# Patient Record
Sex: Male | Born: 1980 | Race: Black or African American | Hispanic: No
Health system: Southern US, Community
[De-identification: ages and names within clinical notes are randomized; demographics above are authoritative.]

## PROBLEM LIST (undated history)

## (undated) DIAGNOSIS — J45909 Unspecified asthma, uncomplicated: Secondary | ICD-10-CM

## (undated) DIAGNOSIS — E785 Hyperlipidemia, unspecified: Secondary | ICD-10-CM

## (undated) DIAGNOSIS — G4730 Sleep apnea, unspecified: Secondary | ICD-10-CM

## (undated) HISTORY — DX: Hyperlipidemia, unspecified: E78.5

## (undated) HISTORY — PX: TONSILLECTOMY: SUR1361

## (undated) HISTORY — DX: Sleep apnea, unspecified: G47.30

## (undated) HISTORY — PX: HERNIA REPAIR: SHX51

---

## 2015-05-18 ENCOUNTER — Encounter (HOSPITAL_COMMUNITY): Payer: Self-pay | Admitting: *Deleted

## 2015-05-18 ENCOUNTER — Emergency Department (HOSPITAL_COMMUNITY)
Admission: EM | Admit: 2015-05-18 | Discharge: 2015-05-18 | Disposition: A | Discharge status: Home / Self Care | Payer: Self-pay | Attending: Emergency Medicine | Admitting: Emergency Medicine

## 2015-05-18 ENCOUNTER — Emergency Department (HOSPITAL_COMMUNITY): Payer: Self-pay

## 2015-05-18 DIAGNOSIS — K611 Rectal abscess: Secondary | ICD-10-CM | POA: Insufficient documentation

## 2015-05-18 DIAGNOSIS — F17210 Nicotine dependence, cigarettes, uncomplicated: Secondary | ICD-10-CM | POA: Insufficient documentation

## 2015-05-18 DIAGNOSIS — M545 Low back pain: Secondary | ICD-10-CM | POA: Insufficient documentation

## 2015-05-18 LAB — BASIC METABOLIC PANEL
ANION GAP: 8 (ref 5–15)
BUN: <5 mg/dL — ABNORMAL LOW (ref 6–20)
CALCIUM: 9 mg/dL (ref 8.9–10.3)
CO2: 26 mmol/L (ref 22–32)
Chloride: 106 mmol/L (ref 101–111)
Creatinine, Ser: 1.21 mg/dL (ref 0.61–1.24)
Glucose, Bld: 112 mg/dL — ABNORMAL HIGH (ref 65–99)
Potassium: 4 mmol/L (ref 3.5–5.1)
Sodium: 140 mmol/L (ref 135–145)

## 2015-05-18 LAB — CBC WITH DIFFERENTIAL/PLATELET
BASOS ABS: 0 10*3/uL (ref 0.0–0.1)
BASOS PCT: 0 %
Eosinophils Absolute: 0.1 10*3/uL (ref 0.0–0.7)
Eosinophils Relative: 1 %
HEMATOCRIT: 49.2 % (ref 39.0–52.0)
Hemoglobin: 16.6 g/dL (ref 13.0–17.0)
Lymphocytes Relative: 10 %
Lymphs Abs: 1.4 10*3/uL (ref 0.7–4.0)
MCH: 28.2 pg (ref 26.0–34.0)
MCHC: 33.7 g/dL (ref 30.0–36.0)
MCV: 83.5 fL (ref 78.0–100.0)
MONO ABS: 1.5 10*3/uL — AB (ref 0.1–1.0)
Monocytes Relative: 11 %
NEUTROS ABS: 10.6 10*3/uL — AB (ref 1.7–7.7)
NEUTROS PCT: 78 %
Platelets: 224 10*3/uL (ref 150–400)
RBC: 5.89 MIL/uL — AB (ref 4.22–5.81)
RDW: 13.1 % (ref 11.5–15.5)
WBC: 13.6 10*3/uL — AB (ref 4.0–10.5)

## 2015-05-18 LAB — I-STAT CREATININE, ED: CREATININE: 1.1 mg/dL (ref 0.61–1.24)

## 2015-05-18 MED ORDER — LIDOCAINE HCL 2 % EX GEL
1.0000 "application " | Freq: Once | CUTANEOUS | Status: DC
  Filled 2015-05-18: qty 20

## 2015-05-18 MED ORDER — HYDROMORPHONE HCL 1 MG/ML IJ SOLN
1.0000 mg | Freq: Once | INTRAMUSCULAR | Status: AC
  Administered 2015-05-18: 1 mg via INTRAVENOUS
  Filled 2015-05-18: qty 1

## 2015-05-18 MED ORDER — SODIUM CHLORIDE 0.9 % IV BOLUS (SEPSIS)
1000.0000 mL | Freq: Once | INTRAVENOUS | Status: AC
  Administered 2015-05-18: 1000 mL via INTRAVENOUS

## 2015-05-18 MED ORDER — HYDROCODONE-ACETAMINOPHEN 5-325 MG PO TABS: 2.0000 | ORAL_TABLET | ORAL | Status: DC | PRN

## 2015-05-18 MED ORDER — HYDROMORPHONE HCL 1 MG/ML IJ SOLN
1.0000 mg | INTRAMUSCULAR | Status: DC | PRN
  Administered 2015-05-18 (×2): 1 mg via INTRAVENOUS
  Filled 2015-05-18 (×2): qty 1

## 2015-05-18 MED ORDER — ONDANSETRON HCL 4 MG/2ML IJ SOLN
4.0000 mg | Freq: Once | INTRAMUSCULAR | Status: AC
  Administered 2015-05-18: 4 mg via INTRAVENOUS
  Filled 2015-05-18: qty 2

## 2015-05-18 MED ORDER — LIDOCAINE-EPINEPHRINE (PF) 2 %-1:200000 IJ SOLN
10.0000 mL | Freq: Once | INTRAMUSCULAR | Status: AC
  Administered 2015-05-18: 10 mL
  Filled 2015-05-18: qty 20

## 2015-05-18 MED ORDER — SULFAMETHOXAZOLE-TRIMETHOPRIM 800-160 MG PO TABS: 2.0000 | ORAL_TABLET | Freq: Two times a day (BID) | ORAL | Status: DC

## 2015-05-18 MED ORDER — IOHEXOL 300 MG/ML  SOLN
100.0000 mL | Freq: Once | INTRAMUSCULAR | Status: AC | PRN
  Administered 2015-05-18: 100 mL via INTRAVENOUS

## 2015-05-18 NOTE — ED Notes (Signed)
PT reports rectal pain started 3 days ago and Pt reports the pain is so sever he can not have a BM.

## 2015-05-18 NOTE — ED Provider Notes (Signed)
By signing my name below, I, Tanda RockersMargaux Venter, attest that this documentation has been prepared under the direction and in the presence of Arthor CaptainAbigail Thomas Mabry, PA-C.  Electronically Signed: Tanda RockersMargaux Venter, ED Scribe. 05/18/2015. 11:33 AM.  SUBJECTIVE:  Lindell Sparoy L Germain Jr. is a 34 y.o. male who presents to the Emergency Department complaining of gradual onset, constant, 10/10, pain in the rectum and buttocks x 3 days. Pt cannot cough or have a bowel movement without pain. He has been taking Ibuprofen without relief. He also complains of pain behind his right ear that is itchy.                             OBJECTIVE:  Filed Vitals:   05/18/15 1039  BP: 150/93  Pulse: 141  Temp: 98.1 F (36.7 C)  Resp: 24    CONSTITUTIONAL: Pt appears to be in significant pain HEENT: Darkening of the skin folds behind right ear with some maceration that is itchy and tender. No overt signs of infections RECTUM: Bulging and tenderness up above the rectum with warmth and erythema  ASSESSMENT AND PLAN:  Concern for perirectal abscess. Pt getting fluids, CT Pelvis W Contrast. Will be moved to level of higher acuity.      I personally performed the services described in this documentation, which was scribed in my presence. The recorded information has been reviewed and is accurate.           Arthor Captainbigail Tarren Sabree, PA-C 05/18/15 1147  Rolland PorterMark James, MD 05/18/15 1420

## 2015-05-18 NOTE — ED Notes (Signed)
MD at bedside. 

## 2015-05-18 NOTE — ED Notes (Signed)
PT reports he last ate at 2100  On 05-17-15

## 2015-05-18 NOTE — ED Notes (Addendum)
Jelly applied; suture cart at bedside

## 2015-05-18 NOTE — Discharge Instructions (Signed)
Warm soaks, gently massage the area 2-3 times per day. Recheck with general surgery if this recurs.   Perirectal Abscess An abscess is an infected area that contains a collection of pus. A perirectal abscess is an abscess that is near the opening of the anus or around the rectum. A perirectal abscess can cause a lot of pain, especially during bowel movements. CAUSES This condition is almost always caused by an infection that starts in an anal gland. RISK FACTORS This condition is more likely to develop in:  People with diabetes or inflammatory bowel disease.  People whose body defense system (immune system) is weak.  People who have anal sex.  People who have a sexually transmitted disease (STD).  People who have certain kinds of cancers, such as rectal carcinoma, leukemia, or lymphoma. SYMPTOMS The main symptom of this condition is pain. The pain may be a throbbing pain that gets worse during bowel movements. Other symptoms include:  Fever.  Swelling.  Redness.  Bleeding.  Constipation. DIAGNOSIS The condition is diagnosed with a physical exam. If the abscess is not visible, a health care provider may need to place a finger inside the rectum to find the abscess. Sometimes, imaging tests are done to determine the size and location of the abscess. These tests may include:  An ultrasound.  An MRI.  A CT scan. TREATMENT This condition is usually treated with incision and drainage surgery. Incision and drainage surgery involves making an incision over the abscess to drain the pus. Treatment may also involve antibiotic medicine, pain medicine, stool softeners, or laxatives. HOME CARE INSTRUCTIONS  Take medicines only as directed by your health care provider.  If you were prescribed an antibiotic, finish all of it even if you start to feel better.  To relieve pain, try sitting:  In a warm, shallow bath (sitz bath).  On a heating pad with the setting on low.  On an  inflatable donut-shaped cushion.  Follow any diet instructions as directed by your health care provider.  Keep all follow-up visits as directed by your health care provider. This is important. SEEK MEDICAL CARE IF:  Your abscess is bleeding.  You have pain, swelling, or redness that is getting worse.  You are constipated.  You feel ill.  You have muscle aches or chills.  You have a fever.  Your symptoms return after the abscess has healed.   This information is not intended to replace advice given to you by your health care provider. Make sure you discuss any questions you have with your health care provider.   Document Released: 05/17/2000 Document Revised: 02/08/2015 Document Reviewed: 03/30/2014 Elsevier Interactive Patient Education [employer] Elsevier Inc.

## 2015-05-18 NOTE — ED Provider Notes (Signed)
CSN: [id number]     Arrival date & time 05/18/15  1033 History   First MD Initiated Contact with Patient 05/18/15 1117     Chief Complaint  Patient presents with  . Rectal Pain  . Back Pain      HPI  She presents for evaluation of rectal pain. Symptoms for 3 days. Having bowel movements to yesterday. Started taking a laxative yesterday still bowel movements today. He has a painful swollen tender area is perirectal area. Presents here for evaluation. No past similar episodes.  History reviewed. No pertinent past medical history. Past Surgical History  Procedure Laterality Date  . Tonsillectomy    . Hernia repair      at 11 years   History reviewed. No pertinent family history. Social History  Substance Use Topics  . Smoking status: Current Some Day Smoker    Types: Cigarettes  . Smokeless tobacco: Never Used  . Alcohol Use: No    Review of Systems  Constitutional: Negative for fever, chills, diaphoresis, appetite change and fatigue.  HENT: Negative for mouth sores, sore throat and trouble swallowing.   Eyes: Negative for visual disturbance.  Respiratory: Negative for cough, chest tightness, shortness of breath and wheezing.   Cardiovascular: Negative for chest pain.  Gastrointestinal: Negative for nausea, vomiting, abdominal pain, diarrhea and abdominal distention.       Rectal pain and constipation  Endocrine: Negative for polydipsia, polyphagia and polyuria.  Genitourinary: Negative for dysuria, frequency and hematuria.  Musculoskeletal: Negative for gait problem.  Skin: Negative for color change, pallor and rash.  Neurological: Negative for dizziness, syncope, light-headedness and headaches.  Hematological: Does not bruise/bleed easily.  Psychiatric/Behavioral: Negative for behavioral problems and confusion.      Allergies  Review of patient's allergies indicates no known allergies.  Home Medications   Prior to Admission medications   Medication Sig Start  Date End Date Taking? Authorizing Provider  ibuprofen (ADVIL,MOTRIN) 200 MG tablet Take 400 mg by mouth every 6 (six) hours as needed for mild pain.   Yes Historical Provider, MD  HYDROcodone-acetaminophen (NORCO/VICODIN) 5-325 MG tablet Take 2 tablets by mouth every 4 (four) hours as needed. 05/18/15   Rolland PorterMark Kayson Bullis, MD  sulfamethoxazole-trimethoprim (BACTRIM DS,SEPTRA DS) 800-160 MG tablet Take 2 tablets by mouth 2 (two) times daily. 05/18/15   Rolland PorterMark Kenji Mapel, MD   BP 105/56 mmHg  Pulse 112  Temp(Src) 98.1 F (36.7 C) (Oral)  Resp 20  SpO2 93% Physical Exam  Constitutional: He is oriented to person, place, and time. He appears well-developed and well-nourished. No distress.  HENT:  Head: Normocephalic.  Eyes: Conjunctivae are normal. Pupils are equal, round, and reactive to light. No scleral icterus.  Neck: Normal range of motion. Neck supple. No thyromegaly present.  Cardiovascular: Normal rate and regular rhythm.  Exam reveals no gallop and no friction rub.   No murmur heard. Pulmonary/Chest: Effort normal and breath sounds normal. No respiratory distress. He has no wheezes. He has no rales.  Abdominal: Soft. Bowel sounds are normal. He exhibits no distension. There is no tenderness. There is no rebound.  Genitourinary:     Musculoskeletal: Normal range of motion.  Neurological: He is alert and oriented to person, place, and time.  Skin: Skin is warm and dry. No rash noted.  Psychiatric: He has a normal mood and affect. His behavior is normal.    ED Course  Procedures (including critical care time) Labs Review Labs Reviewed  CBC WITH DIFFERENTIAL/PLATELET - Abnormal; Notable for the  following:    WBC 13.6 (*)    RBC 5.89 (*)    Neutro Abs 10.6 (*)    Monocytes Absolute 1.5 (*)    All other components within normal limits  BASIC METABOLIC PANEL - Abnormal; Notable for the following:    Glucose, Bld 112 (*)    BUN <5 (*)    All other components within normal limits  I-STAT  CREATININE, ED    Imaging Review Ct Pelvis W Contrast  05/18/2015  CLINICAL DATA:  Acute rectal pain. EXAM: CT PELVIS WITH CONTRAST TECHNIQUE: Multidetector CT imaging of the pelvis was performed using the standard protocol following the bolus administration of intravenous contrast. CONTRAST:  OMNIPAQUE IOHEXOL 300 MG/ML  SOLN COMPARISON:  None. FINDINGS: Urinary bladder appears normal. The appendix appears normal. There is no evidence of bowel obstruction. No significant adenopathy is noted. No osseous abnormality is noted. Multi lobulated fluid collection is seen posterior to the distal rectum and anus which measures 3.3 x 1.7 x 1.8 cm, concerning for abscess. IMPRESSION: Probable 3.3 x 1.8 x 1.7 cm multi lobulated abscess seen posterior to the distal rectum and anus. Electronically Signed   By: Lupita Raider, M.D.   On: 05/18/2015 14:04   I have personally reviewed and evaluated these images and lab results as part of my medical decision-making.   EKG Interpretation None      MDM   Final diagnoses:  Perirectal abscess    I again discussed drainage with the patient after CT. This is not appear to extend deep pelvic structures. I am able to visualize again, with great difficulty due to his discomfort, the area of abscess. However, he will not consent for me to drain this in the emergency room. I discussed general surgical consultation with him.  INCISION AND DRAINAGE Performed by: Claudean Kinds Consent: Verbal consent obtained. Risks and benefits: risks, benefits and alternatives were discussed Type: abscess  Body area: Peri rectal  Anesthesia: local infiltration  Incision was made with a scalpel.  Local anesthetic: lidocaine 1% c epinephrine  Anesthetic total: 4 ml  Complexity: complex Blunt dissection to break up loculations  Drainage: purulent  Drainage amount: moderate  Packing material: 1/4 in iodoform gauze  Patient tolerance: Patient tolerated the  procedure well with no immediate complications.       Rolland Porter, MD 05/18/15 [phone]

## 2015-10-06 ENCOUNTER — Encounter (HOSPITAL_COMMUNITY): Payer: Self-pay | Admitting: Emergency Medicine

## 2015-10-06 ENCOUNTER — Emergency Department (HOSPITAL_COMMUNITY): Payer: Self-pay

## 2015-10-06 ENCOUNTER — Emergency Department (HOSPITAL_COMMUNITY)
Admission: EM | Admit: 2015-10-06 | Discharge: 2015-10-06 | Disposition: A | Discharge status: Home / Self Care | Payer: Self-pay | Attending: Emergency Medicine | Admitting: Emergency Medicine

## 2015-10-06 DIAGNOSIS — R002 Palpitations: Secondary | ICD-10-CM | POA: Insufficient documentation

## 2015-10-06 DIAGNOSIS — Z792 Long term (current) use of antibiotics: Secondary | ICD-10-CM | POA: Insufficient documentation

## 2015-10-06 DIAGNOSIS — R0789 Other chest pain: Secondary | ICD-10-CM | POA: Insufficient documentation

## 2015-10-06 DIAGNOSIS — R000 Tachycardia, unspecified: Secondary | ICD-10-CM | POA: Insufficient documentation

## 2015-10-06 DIAGNOSIS — F17210 Nicotine dependence, cigarettes, uncomplicated: Secondary | ICD-10-CM | POA: Insufficient documentation

## 2015-10-06 DIAGNOSIS — M2550 Pain in unspecified joint: Secondary | ICD-10-CM | POA: Insufficient documentation

## 2015-10-06 LAB — CBC
HCT: 47 % (ref 39.0–52.0)
HEMOGLOBIN: 15.6 g/dL (ref 13.0–17.0)
MCH: 27.8 pg (ref 26.0–34.0)
MCHC: 33.2 g/dL (ref 30.0–36.0)
MCV: 83.6 fL (ref 78.0–100.0)
PLATELETS: 239 10*3/uL (ref 150–400)
RBC: 5.62 MIL/uL (ref 4.22–5.81)
RDW: 13.4 % (ref 11.5–15.5)
WBC: 9.3 10*3/uL (ref 4.0–10.5)

## 2015-10-06 LAB — D-DIMER, QUANTITATIVE (NOT AT ARMC): D DIMER QUANT: 0.33 ug{FEU}/mL (ref 0.00–0.50)

## 2015-10-06 LAB — I-STAT TROPONIN, ED: Troponin i, poc: 0 ng/mL (ref 0.00–0.08)

## 2015-10-06 LAB — BASIC METABOLIC PANEL
ANION GAP: 13 (ref 5–15)
BUN: 8 mg/dL (ref 6–20)
CALCIUM: 8.7 mg/dL — AB (ref 8.9–10.3)
CHLORIDE: 106 mmol/L (ref 101–111)
CO2: 24 mmol/L (ref 22–32)
CREATININE: 1.3 mg/dL — AB (ref 0.61–1.24)
GFR calc non Af Amer: >60 mL/min (ref >60)
Glucose, Bld: 151 mg/dL — ABNORMAL HIGH (ref 65–99)
Potassium: 3.4 mmol/L — ABNORMAL LOW (ref 3.5–5.1)
SODIUM: 143 mmol/L (ref 135–145)

## 2015-10-06 NOTE — Discharge Instructions (Signed)
Return to the emergency department if your symptoms significantly worsen or change.   Palpitations A palpitation is the feeling that your heartbeat is irregular or is faster than normal. It may feel like your heart is fluttering or skipping a beat. Palpitations are usually not a serious problem. However, in some cases, you may need further medical evaluation. CAUSES  Palpitations can be caused by:  Smoking.  Caffeine or other stimulants, such as diet pills or energy drinks.  Alcohol.  Stress and anxiety.  Strenuous physical activity.  Fatigue.  Certain medicines.  Heart disease, especially if you have a history of irregular heart rhythms (arrhythmias), such as atrial fibrillation, atrial flutter, or supraventricular tachycardia.  An improperly working pacemaker or defibrillator. DIAGNOSIS  To find the cause of your palpitations, your health care provider will take your medical history and perform a physical exam. Your health care provider may also have you take a test called an ambulatory electrocardiogram (ECG). An ECG records your heartbeat patterns over a 24-hour period. You may also have other tests, such as:  Transthoracic echocardiogram (TTE). During echocardiography, sound waves are used to evaluate how blood flows through your heart.  Transesophageal echocardiogram (TEE).  Cardiac monitoring. This allows your health care provider to monitor your heart rate and rhythm in real time.  Holter monitor. This is a portable device that records your heartbeat and can help diagnose heart arrhythmias. It allows your health care provider to track your heart activity for several days, if needed.  Stress tests by exercise or by giving medicine that makes the heart beat faster. TREATMENT  Treatment of palpitations depends on the cause of your symptoms and can vary greatly. Most cases of palpitations do not require any treatment other than time, relaxation, and monitoring your symptoms.  Other causes, such as atrial fibrillation, atrial flutter, or supraventricular tachycardia, usually require further treatment. HOME CARE INSTRUCTIONS   Avoid:  Caffeinated coffee, tea, soft drinks, diet pills, and energy drinks.  Chocolate.  Alcohol.  Stop smoking if you smoke.  Reduce your stress and anxiety. Things that can help you relax include:  A method of controlling things in your body, such as your heartbeats, with your mind (biofeedback).  Yoga.  Meditation.  Physical activity such as swimming, jogging, or walking.  Get plenty of rest and sleep. SEEK MEDICAL CARE IF:   You continue to have a fast or irregular heartbeat beyond 24 hours.  Your palpitations occur more often. SEEK IMMEDIATE MEDICAL CARE IF:  You have chest pain or shortness of breath.  You have a severe headache.  You feel dizzy or you faint. MAKE SURE YOU:  Understand these instructions.  Will watch your condition.  Will get help right away if you are not doing well or get worse.   This information is not intended to replace advice given to you by your health care provider. Make sure you discuss any questions you have with your health care provider.   Document Released: 05/17/2000 Document Revised: 05/25/2013 Document Reviewed: 07/19/2011 Elsevier Interactive Patient Education [employer] Elsevier Inc.

## 2015-10-06 NOTE — ED Notes (Signed)
Pt st's right chest pain woke him from sleep approx 30 mins ago.  Describes as a squeezing type pain.  Nausea without vomiting.  Denies diaphoresis

## 2015-10-06 NOTE — ED Provider Notes (Signed)
CSN: [id number]     Arrival date & time 10/06/15  0259 History   By signing my name below, I, Arlan OrganAshley Leger, attest that this documentation has been prepared under the direction and in the presence of Geoffery Lyonsouglas Trek Kimball, MD.  Electronically Signed: Arlan OrganAshley Leger, ED Scribe. 10/06/2015. 3:44 AM.   Chief Complaint  Patient presents with  . Chest Pain   The history is provided by the patient. No language interpreter was used.    HPI Comments: Travis SparRoy L Snelling Jr. is a 35 y.o. male without any pertinent past medical history who presents to the Emergency Department complaining of constant, ongoing L sided chest pain onset 30 minutes prior to arrival. Pain is described as tightness. He also reports a tightening sensation to the L arm and states he felt his heart racing. No aggravating or alleviating factors at this time. No OTC medications or home remedies attempted prior to arrival. No recent fever, chills, nausea, vomiting, diaphoresis, or shortness of breath. No prior personal history of heart issues. Denies any history of similar episodes. Pt denies any illicit drug use or alcohol consumption. Denies any recent caffeine intake. No known allergies to medications.  PCP: No primary care provider on file.    History reviewed. No pertinent past medical history. Past Surgical History  Procedure Laterality Date  . Tonsillectomy    . Hernia repair      at 11 years   No family history on file. Social History  Substance Use Topics  . Smoking status: Current Some Day Smoker    Types: Cigarettes  . Smokeless tobacco: Never Used  . Alcohol Use: No    Review of Systems  Constitutional: Negative for fever and chills.  Cardiovascular: Positive for chest pain and palpitations.  Gastrointestinal: Negative for nausea, vomiting and abdominal pain.  Musculoskeletal: Positive for arthralgias.  Neurological: Negative for headaches.  Psychiatric/Behavioral: Negative for confusion.  All other systems reviewed and are  negative.     Allergies  Review of patient's allergies indicates no known allergies.  Home Medications   Prior to Admission medications   Medication Sig Start Date End Date Taking? Authorizing Provider  HYDROcodone-acetaminophen (NORCO/VICODIN) 5-325 MG tablet Take 2 tablets by mouth every 4 (four) hours as needed. 05/18/15   Rolland PorterMark James, MD  ibuprofen (ADVIL,MOTRIN) 200 MG tablet Take 400 mg by mouth every 6 (six) hours as needed for mild pain.    Historical Provider, MD  sulfamethoxazole-trimethoprim (BACTRIM DS,SEPTRA DS) 800-160 MG tablet Take 2 tablets by mouth 2 (two) times daily. 05/18/15   Rolland PorterMark James, MD   Triage Vitals: BP 174/97 mmHg  Pulse 139  Temp(Src) 99.2 F (37.3 C) (Oral)  Resp 20  Ht 5\' 6"  (1.676 m)  Wt 262 lb 7 oz (119.041 kg)  BMI 42.38 kg/m2  SpO2 100%   Physical Exam  Constitutional: He is oriented to person, place, and time. He appears well-developed and well-nourished.  HENT:  Head: Normocephalic and atraumatic.  Eyes: EOM are normal.  Neck: Normal range of motion.  Cardiovascular: Regular rhythm, normal heart sounds and intact distal pulses.  Tachycardia present.   Pulmonary/Chest: Effort normal and breath sounds normal. No respiratory distress. He has no wheezes. He has no rales.  Abdominal: Soft. He exhibits no distension. There is no tenderness.  Musculoskeletal: Normal range of motion.  Neurological: He is alert and oriented to person, place, and time.  Skin: Skin is warm and dry.  Psychiatric: He has a normal mood and affect. Judgment normal.  Nursing note and vitals reviewed.   ED Course  Procedures (including critical care time)  DIAGNOSTIC STUDIES: Oxygen Saturation is 100% on RA, Normal by my interpretation.    COORDINATION OF CARE: 3:37 AM- Will order CXR, blood work, and EKG. Discussed treatment plan with pt at bedside and pt agreed to plan.     Labs Review Labs Reviewed  CBC  BASIC METABOLIC PANEL  I-STAT TROPOININ, ED     Imaging Review No results found. I have personally reviewed and evaluated these images and lab results as part of my medical decision-making.   EKG Interpretation   Date/Time:  Friday Oct 06 2015 03:04:23 EDT Ventricular Rate:  139 PR Interval:  126 QRS Duration: 78 QT Interval:  290 QTC Calculation: 441 R Axis:   79 Text Interpretation:  Sinus tachycardia ST \\T \ T wave abnormality,  consider inferior ischemia Abnormal ECG Confirmed by Yelitza Reach  MD, Williamson Cavanah  ([id number]) on 10/06/2015 3:40:54 AM      MDM   Final diagnoses:  None    Patient presents with complaints of heart palpitations. This started while he was sleeping and woke him from sleep. He was initially tachycardic upon presentation with a heart rate of 139. His initial EKG revealed a sinus tachycardia, but no other arrhythmia area he was observed on the monitor during which time his heart rate slowed to the low 90s. Workup was initiated including laboratory studies and chest x-ray. These were centrally unremarkable. A d-dimer was also obtained which was negative. I highly doubt pulmonary embolism. He does appear to be feeling better and I believe is appropriate for discharge. He is to return as needed for any problems.  I personally performed the services described in this documentation, which was scribed in my presence. The recorded information has been reviewed and is accurate.       Geoffery Lyons, MD 10/06/15 [phone]

## 2015-10-06 NOTE — ED Notes (Signed)
Patient presents stating he woke up with tightness to the left arm and pin point pain on the right side below the breast.  Stated he has never had this pain before.  Patient states the tightness to the arm is less but still notices it  Denies SOB,diaphoesis

## 2017-02-04 ENCOUNTER — Emergency Department (HOSPITAL_COMMUNITY)
Admission: EM | Admit: 2017-02-04 | Discharge: 2017-02-04 | Disposition: A | Discharge status: Home / Self Care | Payer: Self-pay | Attending: Emergency Medicine | Admitting: Emergency Medicine

## 2017-02-04 ENCOUNTER — Encounter (HOSPITAL_COMMUNITY): Payer: Self-pay | Admitting: Emergency Medicine

## 2017-02-04 DIAGNOSIS — M79652 Pain in left thigh: Secondary | ICD-10-CM | POA: Insufficient documentation

## 2017-02-04 DIAGNOSIS — F17210 Nicotine dependence, cigarettes, uncomplicated: Secondary | ICD-10-CM | POA: Insufficient documentation

## 2017-02-04 DIAGNOSIS — R202 Paresthesia of skin: Secondary | ICD-10-CM

## 2017-02-04 LAB — I-STAT CHEM 8, ED
BUN: 6 mg/dL (ref 6–20)
CALCIUM ION: 1.17 mmol/L (ref 1.15–1.40)
Chloride: 106 mmol/L (ref 101–111)
Creatinine, Ser: 1 mg/dL (ref 0.61–1.24)
GLUCOSE: 100 mg/dL — AB (ref 65–99)
HCT: 46 % (ref 39.0–52.0)
HEMOGLOBIN: 15.6 g/dL (ref 13.0–17.0)
POTASSIUM: 3.8 mmol/L (ref 3.5–5.1)
SODIUM: 143 mmol/L (ref 135–145)
TCO2: 27 mmol/L (ref 22–32)

## 2017-02-04 MED ORDER — IBUPROFEN 800 MG PO TABS: 800.0000 mg | ORAL_TABLET | Freq: Three times a day (TID) | ORAL | 0 refills | Status: DC | PRN

## 2017-02-04 NOTE — ED Triage Notes (Signed)
Pt reports numbness in outer left thigh x2 weeks, states walking makes it worse, denies any falls or back injuries.

## 2017-02-04 NOTE — Discharge Instructions (Signed)
Take the medication as prescribed. Call the number on these instructions to get a primary care physician and arrange to be seen if not feeling improved in a week. Ask your new primary care physician to help you to stop smoking

## 2017-02-04 NOTE — ED Provider Notes (Signed)
[facility]-EMERGENCY DEPT Provider Note   CSN: [id number] Arrival date & time: 02/04/17  0709     History   Chief Complaint Chief Complaint  Patient presents with  . Numbness    HPI Travis SparRoy L Halperin Jr. is a 36 y.o. male.  HPI Patient complains of pain and numbness in his left lateral thigh gradual in onset 2 weeks ago. He describes a sensation of "like when your foot falls asleep" discomfort is nonradiating worse with walking and improved with rest. No trauma no fever treated with Tylenol without relief. No other associated symptoms History reviewed. No pertinent past medical history.  There are no active problems to display for this patient.   Past Surgical History:  Procedure Laterality Date  . HERNIA REPAIR     at 11 years  . TONSILLECTOMY         Home Medications    Prior to Admission medications   Medication Sig Start Date End Date Taking? Authorizing Provider  HYDROcodone-acetaminophen (NORCO/VICODIN) 5-325 MG tablet Take 2 tablets by mouth every 4 (four) hours as needed. Patient not taking: Reported on 10/06/2015 05/18/15   Rolland PorterJames, Mark, MD  sulfamethoxazole-trimethoprim (BACTRIM DS,SEPTRA DS) 800-160 MG tablet Take 2 tablets by mouth 2 (two) times daily. Patient not taking: Reported on 10/06/2015 05/18/15   Rolland PorterJames, Mark, MD    Family History No family history on file.  Social History Social History  Substance Use Topics  . Smoking status: Current Some Day Smoker    Types: Cigarettes  . Smokeless tobacco: Never Used  . Alcohol use No   Denies illicit drug  Allergies   Patient has no known allergies.   Review of Systems Review of Systems  Constitutional: Negative.   HENT: Negative.   Respiratory: Negative.   Cardiovascular: Negative.   Gastrointestinal: Negative.   Musculoskeletal: Positive for myalgias.       Pain at left lateral thigh  Skin: Negative.   Neurological: Positive for numbness.       Numbness  in left lateral thigh  Psychiatric/Behavioral:  Negative.   All other systems reviewed and are negative.    Physical Exam Updated Vital Signs BP 125/81 (BP Location: Right Arm)   Pulse 61   Temp 98.9 F (37.2 C) (Oral)   Resp 14   SpO2 100%   Physical Exam  Constitutional: He is oriented to person, place, and time. He appears well-developed and well-nourished.  HENT:  Head: Normocephalic and atraumatic.  Eyes: Pupils are equal, round, and reactive to light. Conjunctivae are normal.  Neck: Neck supple. No tracheal deviation present. No thyromegaly present.  Cardiovascular: Normal rate and regular rhythm.   No murmur heard. Pulmonary/Chest: Effort normal and breath sounds normal.  Abdominal: Soft. Bowel sounds are normal. He exhibits no distension. There is no tenderness.  Musculoskeletal: Normal range of motion. He exhibits no edema or tenderness.  Left lower extremity no redness no swelling no tenderness. Good capillary refill area DP pulses 2+ bilaterally. All extremities without redness swelling or tenderness neurovascularly intact  Neurological: He is alert and oriented to person, place, and time. Coordination normal.  Gait normal  Skin: Skin is warm and dry. No rash noted.  Psychiatric: He has a normal mood and affect.  Nursing note and vitals reviewed.    ED Treatments / Results  Labs (all labs ordered are listed, but only abnormal results are displayed) Labs Reviewed - No data to display  EKG  EKG Interpretation None       Radiology  No results found.  Procedures Procedures (including critical care time)  Medications Ordered in ED Medications - No data to display Results for orders placed or performed during the hospital encounter of 02/04/17  I-stat chem 8, ed  Result Value Ref Range   Sodium 143 135 - 145 mmol/L   Potassium 3.8 3.5 - 5.1 mmol/L   Chloride 106 101 - 111 mmol/L   BUN 6 6 - 20 mg/dL   Creatinine, Ser 1.61 0.61 - 1.24 mg/dL   Glucose, Bld 096 (H) 65 - 99 mg/dL   Calcium, Ion 0.45  4.09 - 1.40 mmol/L   TCO2 27 22 - 32 mmol/L   Hemoglobin 15.6 13.0 - 17.0 g/dL   HCT 81.1 91.4 - 78.2 %   No results found.  Initial Impression / Assessment and Plan / ED Course  I have reviewed the triage vital signs and the nursing notes.  Pertinent labs & imaging results that were available during my care of the patient were reviewed by me and considered in my medical decision making (see chart for details).     Etiology of symptoms unclear. No evidence of phlebitis or circulatory compromise or infection.  prescription ibuprofen referral primary care. I counseled patient for 5 minutes on smoking cessation Final Clinical Impressions(s) / ED Diagnoses  Dx #1 left thigh pain #2 paresthesias #3 tobacco abuse Final diagnoses:  None    New Prescriptions New Prescriptions   No medications on file     Doug Sou, MD 02/04/17 1015

## 2017-02-21 ENCOUNTER — Encounter (HOSPITAL_COMMUNITY): Payer: Self-pay

## 2017-02-21 ENCOUNTER — Emergency Department (HOSPITAL_COMMUNITY)
Admission: EM | Admit: 2017-02-21 | Discharge: 2017-02-21 | Disposition: A | Discharge status: Home / Self Care | Payer: Self-pay | Attending: Emergency Medicine | Admitting: Emergency Medicine

## 2017-02-21 ENCOUNTER — Emergency Department (HOSPITAL_COMMUNITY): Payer: Self-pay

## 2017-02-21 DIAGNOSIS — R0602 Shortness of breath: Secondary | ICD-10-CM | POA: Insufficient documentation

## 2017-02-21 DIAGNOSIS — F17210 Nicotine dependence, cigarettes, uncomplicated: Secondary | ICD-10-CM | POA: Insufficient documentation

## 2017-02-21 DIAGNOSIS — R200 Anesthesia of skin: Secondary | ICD-10-CM | POA: Insufficient documentation

## 2017-02-21 DIAGNOSIS — Z791 Long term (current) use of non-steroidal anti-inflammatories (NSAID): Secondary | ICD-10-CM | POA: Insufficient documentation

## 2017-02-21 DIAGNOSIS — R42 Dizziness and giddiness: Secondary | ICD-10-CM | POA: Insufficient documentation

## 2017-02-21 DIAGNOSIS — R51 Headache: Secondary | ICD-10-CM | POA: Insufficient documentation

## 2017-02-21 DIAGNOSIS — R519 Headache, unspecified: Secondary | ICD-10-CM

## 2017-02-21 LAB — COMPREHENSIVE METABOLIC PANEL
ALT: 16 U/L — ABNORMAL LOW (ref 17–63)
ANION GAP: 5 (ref 5–15)
AST: 15 U/L (ref 15–41)
Albumin: 3.3 g/dL — ABNORMAL LOW (ref 3.5–5.0)
Alkaline Phosphatase: 45 U/L (ref 38–126)
BUN: 8 mg/dL (ref 6–20)
CHLORIDE: 110 mmol/L (ref 101–111)
CO2: 25 mmol/L (ref 22–32)
Calcium: 8.6 mg/dL — ABNORMAL LOW (ref 8.9–10.3)
Creatinine, Ser: 1.08 mg/dL (ref 0.61–1.24)
Glucose, Bld: 98 mg/dL (ref 65–99)
POTASSIUM: 4 mmol/L (ref 3.5–5.1)
SODIUM: 140 mmol/L (ref 135–145)
Total Bilirubin: 0.6 mg/dL (ref 0.3–1.2)
Total Protein: 5.8 g/dL — ABNORMAL LOW (ref 6.5–8.1)

## 2017-02-21 LAB — I-STAT TROPONIN, ED: TROPONIN I, POC: 0 ng/mL (ref 0.00–0.08)

## 2017-02-21 LAB — CBC WITH DIFFERENTIAL/PLATELET
Basophils Absolute: 0 10*3/uL (ref 0.0–0.1)
Basophils Relative: 1 %
EOS ABS: 0.2 10*3/uL (ref 0.0–0.7)
EOS PCT: 4 %
HCT: 46.9 % (ref 39.0–52.0)
Hemoglobin: 15.8 g/dL (ref 13.0–17.0)
LYMPHS ABS: 2 10*3/uL (ref 0.7–4.0)
LYMPHS PCT: 36 %
MCH: 27.7 pg (ref 26.0–34.0)
MCHC: 33.7 g/dL (ref 30.0–36.0)
MCV: 82.1 fL (ref 78.0–100.0)
Monocytes Absolute: 0.5 10*3/uL (ref 0.1–1.0)
Monocytes Relative: 10 %
Neutro Abs: 2.7 10*3/uL (ref 1.7–7.7)
Neutrophils Relative %: 49 %
PLATELETS: 195 10*3/uL (ref 150–400)
RBC: 5.71 MIL/uL (ref 4.22–5.81)
RDW: 13.3 % (ref 11.5–15.5)
WBC: 5.4 10*3/uL (ref 4.0–10.5)

## 2017-02-21 LAB — D-DIMER, QUANTITATIVE: D-Dimer, Quant: <0.27 ug/mL-FEU (ref 0.00–0.50)

## 2017-02-21 MED ORDER — SODIUM CHLORIDE 0.9 % IV BOLUS (SEPSIS)
1000.0000 mL | Freq: Once | INTRAVENOUS | Status: AC
  Administered 2017-02-21: 1000 mL via INTRAVENOUS

## 2017-02-21 MED ORDER — METOCLOPRAMIDE HCL 5 MG/ML IJ SOLN
10.0000 mg | Freq: Once | INTRAMUSCULAR | Status: AC
  Administered 2017-02-21: 10 mg via INTRAVENOUS
  Filled 2017-02-21: qty 2

## 2017-02-21 MED ORDER — DIPHENHYDRAMINE HCL 50 MG/ML IJ SOLN
12.5000 mg | Freq: Once | INTRAMUSCULAR | Status: AC
  Administered 2017-02-21: 12.5 mg via INTRAVENOUS
  Filled 2017-02-21: qty 1

## 2017-02-21 NOTE — ED Notes (Signed)
Pt ambulated to room from waiting area. Pt had no complaints while ambulating. Stated no dizziness.

## 2017-02-21 NOTE — ED Triage Notes (Signed)
Pt reports to the ed for feeling like his blood pressure is high. Vs wnl in triage. Patient states he felt woozy yesterday and went to bed at 12 pm last night feeling his normal and then woke up at 0700 with a numbness all across his head and tightness in his left lower leg so he felt like his blood pressure was high. Pt has no weakness or numbness anywhere else, alert, oriented, ambulatory and no difficulty with speech in triage.

## 2017-02-21 NOTE — ED Notes (Signed)
Pt verbalized understanding discharge instructions and denies any further needs or questions at this time. VS stable, ambulatory and steady gait.   

## 2017-02-21 NOTE — Discharge Instructions (Signed)
Take tylenol, motrin for headaches.   See neurologist for further evaluation   Return to ER if you have worse headaches, weakness, numbness, chest pain, shortness of breath.

## 2017-02-21 NOTE — ED Provider Notes (Signed)
[facility] Provider Note   CSN: [id number] Arrival date & time: 02/21/17  4098     History   Chief Complaint Chief Complaint  Patient presents with  . Hypertension    HPI Travis Staton. is a 36 y.o. male who presented with acute onset of headache, dizziness, shortness of breath. Patient states that he woke up this morning with sudden onset of headache as well as dizziness. He also states that he had some numbness on the left side as head as well as bilateral legs. He has some subjective shortness of breath and chest pain as well. He thought his blood pressure was elevated but did not take his blood pressure at home. Patient denies any slurred speech or actual weakness. He seen in the ED about a week ago for numbness in the legs are was thought to have sciatica. No recent travel, no hx of blood clots. Not on BP meds.   The history is provided by the patient.    History reviewed. No pertinent past medical history.  There are no active problems to display for this patient.   Past Surgical History:  Procedure Laterality Date  . HERNIA REPAIR     at 11 years  . TONSILLECTOMY         Home Medications    Prior to Admission medications   Medication Sig Start Date End Date Taking? Authorizing Provider  ibuprofen (ADVIL,MOTRIN) 600 MG tablet Take 600-1,200 mg by mouth every 6 (six) hours as needed for mild pain.   Yes [provider]  HYDROcodone-acetaminophen (NORCO/VICODIN) 5-325 MG tablet Take 2 tablets by mouth every 4 (four) hours as needed. Patient not taking: Reported on 10/06/2015 05/18/15   Rolland Porter, MD  ibuprofen (ADVIL,MOTRIN) 800 MG tablet Take 1 tablet (800 mg total) by mouth every 8 (eight) hours as needed for moderate pain. Take with food Patient not taking: Reported on 02/21/2017 02/04/17   Doug Sou, MD    Family History No family history on file.  Social History Social History  Substance Use Topics  . Smoking status: Current Some Day  Smoker    Types: Cigarettes  . Smokeless tobacco: Never Used  . Alcohol use No     Allergies   Patient has no known allergies.   Review of Systems Review of Systems  Respiratory: Positive for shortness of breath.   Cardiovascular: Positive for chest pain.  Neurological: Positive for numbness.  All other systems reviewed and are negative.    Physical Exam Updated Vital Signs BP 108/63   Pulse 61   Temp 98.6 F (37 C) (Oral)   Resp 13   Wt 118.8 kg (262 lb)   SpO2 96%   BMI 42.29 kg/m   Physical Exam  Constitutional: He is oriented to person, place, and time. He appears well-developed and well-nourished.  HENT:  Head: Normocephalic.  Mouth/Throat: Oropharynx is clear and moist.  Eyes: Pupils are equal, round, and reactive to light. Conjunctivae and EOM are normal.  Neck: Normal range of motion. Neck supple.  Cardiovascular: Normal rate, regular rhythm and normal heart sounds.   Pulmonary/Chest: Effort normal and breath sounds normal. No respiratory distress. He has no wheezes.  Abdominal: Soft. Bowel sounds are normal. He exhibits no distension. There is no tenderness.  Musculoskeletal: Normal range of motion.  Good peripheral pulses   Neurological: He is alert and oriented to person, place, and time.  CN 2-12 intact. Nl obvious facial droop. Nl strength and sensation throughout  Skin: Skin is warm.  Psychiatric: He has a normal mood and affect.  Nursing note and vitals reviewed.    ED Treatments / Results  Labs (all labs ordered are listed, but only abnormal results are displayed) Labs Reviewed  COMPREHENSIVE METABOLIC PANEL - Abnormal; Notable for the following:       Result Value   Calcium 8.6 (*)    Total Protein 5.8 (*)    Albumin 3.3 (*)    ALT 16 (*)    All other components within normal limits  CBC WITH DIFFERENTIAL/PLATELET  D-DIMER, QUANTITATIVE (NOT AT [facility])  I-STAT TROPONIN, ED    EKG  EKG Interpretation  Date/Time:  Friday February 21 2017 10:58:44 EDT Ventricular Rate:  75 PR Interval:    QRS Duration: 81 QT Interval:  361 QTC Calculation: 404 R Axis:   69 Text Interpretation:  Sinus rhythm No significant change since last tracing Confirmed by Richardean Canal [phone]) on 02/21/2017 11:04:27 AM Also confirmed by Richardean Canal [phone]), editor Madalyn Rob [phone])  on 02/21/2017 11:08:53 AM       Radiology Dg Chest 2 View  Result Date: 02/21/2017 CLINICAL DATA:  Chest pain. EXAM: CHEST  2 VIEW COMPARISON:  Radiographs of September 21, 2016. FINDINGS: The heart size and mediastinal contours are within normal limits. Both lungs are clear. No pneumothorax or pleural effusion is noted. The visualized skeletal structures are unremarkable. IMPRESSION: No active cardiopulmonary disease. Electronically Signed   By: Lupita Raider, M.D.   On: 02/21/2017 11:59   Ct Head Wo Contrast  Result Date: 02/21/2017 CLINICAL DATA:  Headache. EXAM: CT HEAD WITHOUT CONTRAST TECHNIQUE: Contiguous axial images were obtained from the base of the skull through the vertex without intravenous contrast. COMPARISON:  None. FINDINGS: Brain: No evidence of acute infarction, hemorrhage, hydrocephalus, extra-axial collection or mass lesion/mass effect. Vascular: No hyperdense vessel or unexpected calcification. Skull: Normal. Negative for fracture or focal lesion. Sinuses/Orbits: Left frontal sinusitis. Other: None. IMPRESSION: Normal head CT. Electronically Signed   By: Lupita Raider, M.D.   On: 02/21/2017 11:50    Procedures Procedures (including critical care time)  Medications Ordered in ED Medications  sodium chloride 0.9 % bolus 1,000 mL (0 mLs Intravenous Stopped 02/21/17 1259)  metoCLOPramide (REGLAN) injection 10 mg (10 mg Intravenous Given 02/21/17 1130)  diphenhydrAMINE (BENADRYL) injection 12.5 mg (12.5 mg Intravenous Given 02/21/17 1130)     Initial Impression / Assessment and Plan / ED Course  I have reviewed the triage vital signs and  the nursing notes.  Pertinent labs & imaging results that were available during my care of the patient were reviewed by me and considered in my medical decision making (see chart for details).     Travis Varnell. is a 36 y.o. male here with headaches, shortness of breath, chest pain, numbness. Likely complex migraines. Still has headaches currently. Headache onset around 7 am, less than 6 hr window so if CT head unremarkable, will not need LP. Will get d-dimer to r/o PE. He thought he was hypertensive but BP 129/70 in the ED. Will get labs, trop x 1, CXR. Will give migraine cocktail   2:11 PM CT head, labs, CXR clear. D-dimer neg. Felt better. BP remains normal. Likely complex migraine. Recommend tylenol, motrin. Will refer to neuro outpatient.   Final Clinical Impressions(s) / ED Diagnoses   Final diagnoses:  None    New Prescriptions New Prescriptions   No medications on file  Charlynne Pander, MD 02/21/17 [phone]

## 2017-08-11 ENCOUNTER — Other Ambulatory Visit: Payer: Self-pay

## 2017-08-11 ENCOUNTER — Emergency Department (HOSPITAL_COMMUNITY)
Admission: EM | Admit: 2017-08-11 | Discharge: 2017-08-11 | Disposition: A | Discharge status: Home / Self Care | Payer: Self-pay | Attending: Emergency Medicine | Admitting: Emergency Medicine

## 2017-08-11 ENCOUNTER — Encounter (HOSPITAL_COMMUNITY): Payer: Self-pay | Admitting: *Deleted

## 2017-08-11 DIAGNOSIS — K047 Periapical abscess without sinus: Secondary | ICD-10-CM | POA: Insufficient documentation

## 2017-08-11 DIAGNOSIS — Z87891 Personal history of nicotine dependence: Secondary | ICD-10-CM | POA: Insufficient documentation

## 2017-08-11 DIAGNOSIS — K0889 Other specified disorders of teeth and supporting structures: Secondary | ICD-10-CM

## 2017-08-11 DIAGNOSIS — R1110 Vomiting, unspecified: Secondary | ICD-10-CM | POA: Insufficient documentation

## 2017-08-11 MED ORDER — PENICILLIN V POTASSIUM 250 MG PO TABS
500.0000 mg | ORAL_TABLET | Freq: Once | ORAL | Status: AC
  Administered 2017-08-11: 500 mg via ORAL
  Filled 2017-08-11: qty 2

## 2017-08-11 MED ORDER — PENICILLIN V POTASSIUM 500 MG PO TABS: 500.0000 mg | ORAL_TABLET | Freq: Four times a day (QID) | ORAL | 0 refills | Status: AC

## 2017-08-11 MED ORDER — ONDANSETRON HCL 4 MG PO TABS: 4.0000 mg | ORAL_TABLET | Freq: Three times a day (TID) | ORAL | 0 refills | Status: DC | PRN

## 2017-08-11 MED ORDER — ONDANSETRON HCL 4 MG PO TABS
4.0000 mg | ORAL_TABLET | Freq: Once | ORAL | Status: AC
  Administered 2017-08-11: 4 mg via ORAL
  Filled 2017-08-11: qty 1

## 2017-08-11 NOTE — ED Triage Notes (Signed)
C/o right lower gum swollen ,states it started 2 days ago pain worse today

## 2017-08-11 NOTE — Discharge Instructions (Signed)
Please call to schedule him with a dentist for further evaluation of your dental infection.  Please use the antibiotics as directed for the next week.  Please use the nausea medicine to help with your nausea and stay hydrated.  You may use over-the-counter pain medications to help with the discomfort.  If any symptoms change or worsen, please return to the nearest emergency department.

## 2017-08-11 NOTE — ED Provider Notes (Signed)
[facility] Provider Note   CSN: [id number] Arrival date & time: 08/11/17  [id number]     History   Chief Complaint Chief Complaint  Patient presents with  . Dental Pain    HPI Travis SparRoy L Ruppert Jr. is a 37 y.o. male.  The history is provided by the patient. No language interpreter was used.  Dental Pain   This is a new problem. The current episode started more than 2 days ago. The problem has not changed since onset.The pain is at a severity of 8/10. The pain is moderate. He has tried acetaminophen (ibuprofen) for the symptoms. The treatment provided mild relief.  Emesis   This is a new problem. The problem occurs 2 to 4 times per day. The problem has not changed since onset.The emesis has an appearance of stomach contents. There has been no fever. Pertinent negatives include no abdominal pain, no chills, no cough, no diarrhea, no fever, no headaches and no URI.    History reviewed. No pertinent past medical history.  There are no active problems to display for this patient.   Past Surgical History:  Procedure Laterality Date  . HERNIA REPAIR     at 11 years  . TONSILLECTOMY         Home Medications    Prior to Admission medications   Medication Sig Start Date End Date Taking? Authorizing Provider  HYDROcodone-acetaminophen (NORCO/VICODIN) 5-325 MG tablet Take 2 tablets by mouth every 4 (four) hours as needed. Patient not taking: Reported on 10/06/2015 05/18/15   Rolland PorterJames, Mark, MD  ibuprofen (ADVIL,MOTRIN) 600 MG tablet Take 600-1,200 mg by mouth every 6 (six) hours as needed for mild pain.    [provider]  ibuprofen (ADVIL,MOTRIN) 800 MG tablet Take 1 tablet (800 mg total) by mouth every 8 (eight) hours as needed for moderate pain. Take with food Patient not taking: Reported on 02/21/2017 02/04/17   Doug SouJacubowitz, Sam, MD    Family History No family history on file.  Social History Social History   Tobacco Use  . Smoking  status: Former Smoker    Types: Cigarettes  . Smokeless tobacco: Never Used  Substance Use Topics  . Alcohol use: No  . Drug use: No     Allergies   Patient has no known allergies.   Review of Systems Review of Systems  Constitutional: Negative for chills, fatigue and fever.  HENT: Positive for dental problem. Negative for congestion, drooling, facial swelling, nosebleeds, postnasal drip, rhinorrhea, sore throat, tinnitus, trouble swallowing and voice change.   Eyes: Negative for visual disturbance.  Respiratory: Negative for cough, chest tightness and shortness of breath.   Cardiovascular: Negative for chest pain.  Gastrointestinal: Positive for vomiting. Negative for abdominal pain, constipation and diarrhea.  Genitourinary: Negative for flank pain.  Neurological: Negative for headaches.  Psychiatric/Behavioral: Negative for agitation.  All other systems reviewed and are negative.    Physical Exam Updated Vital Signs BP (!) 144/84 (BP Location: Right Arm)   Pulse 98   Temp 98.7 F (37.1 C) (Oral)   Resp 16   Ht 5\' 6"  (1.676 m)   Wt 113.4 kg (250 lb)   SpO2 98%   BMI 40.35 kg/m   Physical Exam  Constitutional: He is oriented to person, place, and time. He appears well-developed and well-nourished. No distress.  HENT:  Head: Normocephalic and atraumatic.  Mouth/Throat: Oropharynx is clear and moist. He does not have dentures. No trismus in the jaw. Abnormal dentition.  Dental caries present. No uvula swelling or lacerations. No oropharyngeal exudate or tonsillar abscesses.    Tenderness in right lower jaw and gumline.  Tooth missing.  No significant gum changes or drainage.  No area amenable for drainage.    Eyes: Conjunctivae and EOM are normal. Pupils are equal, round, and reactive to light.  Neck: Normal range of motion.  Cardiovascular: Normal rate and intact distal pulses.  No murmur heard. Pulmonary/Chest: Effort normal and breath sounds normal. No  respiratory distress. He has no wheezes. He exhibits no tenderness.  Abdominal: Soft. He exhibits no distension. There is no tenderness.  Musculoskeletal: He exhibits no edema or tenderness.  Neurological: He is alert and oriented to person, place, and time.  Skin: Skin is warm. Capillary refill takes less than 2 seconds. He is not diaphoretic. No erythema. No pallor.  Psychiatric: He has a normal mood and affect.  Nursing note and vitals reviewed.    ED Treatments / Results  Labs (all labs ordered are listed, but only abnormal results are displayed) Labs Reviewed - No data to display  EKG  EKG Interpretation None       Radiology No results found.  Procedures Procedures (including critical care time)  Medications Ordered in ED Medications  penicillin v potassium (VEETID) tablet 500 mg (not administered)  ondansetron (ZOFRAN) tablet 4 mg (not administered)     Initial Impression / Assessment and Plan / ED Course  I have reviewed the triage vital signs and the nursing notes.  Pertinent labs & imaging results that were available during my care of the patient were reviewed by me and considered in my medical decision making (see chart for details).     Travis Spar. is a 37 y.o. male no significant past medical history who presents with right dental pain.  Patient reports that for the last few days he has had a right sided lower dental pain.  He reports that he has a cracked tooth.  He says that he has not seen a dentist for this yet but had nausea and vomiting from the pain.  He reports taking ibuprofen with minimal pain relief.  He denies fevers or chills but does report the nausea.  He denies any difficulty swallowing breathing, or moving his neck.  He denies any trauma.  He has no other complaints on arrival.  On exam, patient has tenderness of his right lower teeth.  No drainable abscess is seen and there is no color change of the gums.  No purulence observed.  Patient  had no stridor and unremarkable posterior oropharyngeal exam.  No evidence of PTA.  No evidence of Ludwick's angina. Lungs clear and chest nontender.  Patient was offered a dental block for his dental pain however he refused.  Patient will be given penicillin and Zofran to help his symptoms and treat the likely dental infection.  Next  Patient given resources on dental infection and dentistry resources available.  Patient will follow up with dentist.  Patient understood return precautions.  Patient discharged in good condition.  Final Clinical Impressions(s) / ED Diagnoses   Final diagnoses:  Pain, dental  Dental infection    ED Discharge Orders        Ordered    penicillin v potassium (VEETID) 500 MG tablet  4 times daily     08/11/17 0852    ondansetron (ZOFRAN) 4 MG tablet  Every 8 hours PRN     08/11/17 1610  Clinical Impression: 1. Pain, dental   2. Dental infection     Disposition: Discharge  Condition: Good  I have discussed the results, Dx and Tx plan with the pt(& family if present). He/she/they expressed understanding and agree(s) with the plan. Discharge instructions discussed at great length. Strict return precautions discussed and pt &/or family have verbalized understanding of the instructions. No further questions at time of discharge.    New Prescriptions   ONDANSETRON (ZOFRAN) 4 MG TABLET    Take 1 tablet (4 mg total) by mouth every 8 (eight) hours as needed for nausea or vomiting.   PENICILLIN V POTASSIUM (VEETID) 500 MG TABLET    Take 1 tablet (500 mg total) by mouth 4 (four) times daily for 7 days.    Follow Up: [facility] EMERGENCY DEPARTMENT [address] [id number] mc [place] [address] [phone]       Ohana Birdwell, Canary Brim, MD 08/11/17 [phone]

## 2017-09-02 ENCOUNTER — Encounter (HOSPITAL_COMMUNITY): Payer: Self-pay | Admitting: Emergency Medicine

## 2017-09-02 ENCOUNTER — Emergency Department (HOSPITAL_COMMUNITY)
Admission: EM | Admit: 2017-09-02 | Discharge: 2017-09-02 | Disposition: A | Discharge status: Home / Self Care | Payer: Self-pay | Attending: Emergency Medicine | Admitting: Emergency Medicine

## 2017-09-02 ENCOUNTER — Other Ambulatory Visit: Payer: Self-pay

## 2017-09-02 ENCOUNTER — Emergency Department (HOSPITAL_COMMUNITY): Payer: Self-pay

## 2017-09-02 DIAGNOSIS — F17210 Nicotine dependence, cigarettes, uncomplicated: Secondary | ICD-10-CM | POA: Insufficient documentation

## 2017-09-02 DIAGNOSIS — E876 Hypokalemia: Secondary | ICD-10-CM | POA: Insufficient documentation

## 2017-09-02 DIAGNOSIS — R519 Headache, unspecified: Secondary | ICD-10-CM

## 2017-09-02 DIAGNOSIS — Z79899 Other long term (current) drug therapy: Secondary | ICD-10-CM | POA: Insufficient documentation

## 2017-09-02 DIAGNOSIS — R202 Paresthesia of skin: Secondary | ICD-10-CM | POA: Insufficient documentation

## 2017-09-02 DIAGNOSIS — R51 Headache: Secondary | ICD-10-CM | POA: Insufficient documentation

## 2017-09-02 LAB — CBC
HCT: 44.8 % (ref 39.0–52.0)
Hemoglobin: 14.7 g/dL (ref 13.0–17.0)
MCH: 27.1 pg (ref 26.0–34.0)
MCHC: 32.8 g/dL (ref 30.0–36.0)
MCV: 82.5 fL (ref 78.0–100.0)
PLATELETS: 237 10*3/uL (ref 150–400)
RBC: 5.43 MIL/uL (ref 4.22–5.81)
RDW: 13.1 % (ref 11.5–15.5)
WBC: 5.6 10*3/uL (ref 4.0–10.5)

## 2017-09-02 LAB — DIFFERENTIAL
BASOS ABS: 0 10*3/uL (ref 0.0–0.1)
BASOS PCT: 0 %
Eosinophils Absolute: 0.5 10*3/uL (ref 0.0–0.7)
Eosinophils Relative: 8 %
LYMPHS PCT: 45 %
Lymphs Abs: 2.5 10*3/uL (ref 0.7–4.0)
MONO ABS: 0.5 10*3/uL (ref 0.1–1.0)
MONOS PCT: 8 %
Neutro Abs: 2.2 10*3/uL (ref 1.7–7.7)
Neutrophils Relative %: 39 %

## 2017-09-02 LAB — COMPREHENSIVE METABOLIC PANEL
ALK PHOS: 44 U/L (ref 38–126)
ALT: 23 U/L (ref 17–63)
AST: 19 U/L (ref 15–41)
Albumin: 3.2 g/dL — ABNORMAL LOW (ref 3.5–5.0)
Anion gap: 9 (ref 5–15)
BUN: 7 mg/dL (ref 6–20)
CALCIUM: 8.5 mg/dL — AB (ref 8.9–10.3)
CHLORIDE: 108 mmol/L (ref 101–111)
CO2: 23 mmol/L (ref 22–32)
CREATININE: 0.97 mg/dL (ref 0.61–1.24)
GFR calc non Af Amer: >60 mL/min (ref >60)
Glucose, Bld: 115 mg/dL — ABNORMAL HIGH (ref 65–99)
Potassium: 3.6 mmol/L (ref 3.5–5.1)
SODIUM: 140 mmol/L (ref 135–145)
Total Bilirubin: 0.6 mg/dL (ref 0.3–1.2)
Total Protein: 5.3 g/dL — ABNORMAL LOW (ref 6.5–8.1)

## 2017-09-02 LAB — PROTIME-INR
INR: 0.98
PROTHROMBIN TIME: 12.9 s (ref 11.4–15.2)

## 2017-09-02 LAB — I-STAT CHEM 8, ED
BUN: 5 mg/dL — ABNORMAL LOW (ref 6–20)
CALCIUM ION: 1.11 mmol/L — AB (ref 1.15–1.40)
CHLORIDE: 107 mmol/L (ref 101–111)
Creatinine, Ser: 1 mg/dL (ref 0.61–1.24)
Glucose, Bld: 108 mg/dL — ABNORMAL HIGH (ref 65–99)
HCT: 42 % (ref 39.0–52.0)
Hemoglobin: 14.3 g/dL (ref 13.0–17.0)
POTASSIUM: 3.4 mmol/L — AB (ref 3.5–5.1)
SODIUM: 141 mmol/L (ref 135–145)
TCO2: 23 mmol/L (ref 22–32)

## 2017-09-02 LAB — URINALYSIS, ROUTINE W REFLEX MICROSCOPIC
Bilirubin Urine: NEGATIVE
Glucose, UA: NEGATIVE mg/dL
Hgb urine dipstick: NEGATIVE
Ketones, ur: NEGATIVE mg/dL
Leukocytes, UA: NEGATIVE
Nitrite: NEGATIVE
PH: 5 (ref 5.0–8.0)
Protein, ur: NEGATIVE mg/dL
SPECIFIC GRAVITY, URINE: 1.017 (ref 1.005–1.030)

## 2017-09-02 LAB — I-STAT TROPONIN, ED: TROPONIN I, POC: 0 ng/mL (ref 0.00–0.08)

## 2017-09-02 LAB — RAPID URINE DRUG SCREEN, HOSP PERFORMED
Amphetamines: NOT DETECTED
Barbiturates: NOT DETECTED
Benzodiazepines: NOT DETECTED
COCAINE: NOT DETECTED
OPIATES: NOT DETECTED
Tetrahydrocannabinol: NOT DETECTED

## 2017-09-02 LAB — APTT: APTT: 31 s (ref 24–36)

## 2017-09-02 LAB — ETHANOL

## 2017-09-02 MED ORDER — KETOROLAC TROMETHAMINE 30 MG/ML IJ SOLN
30.0000 mg | Freq: Once | INTRAMUSCULAR | Status: AC
  Administered 2017-09-02: 30 mg via INTRAVENOUS
  Filled 2017-09-02: qty 1

## 2017-09-02 MED ORDER — DIPHENHYDRAMINE HCL 50 MG/ML IJ SOLN
12.5000 mg | Freq: Once | INTRAMUSCULAR | Status: AC
  Administered 2017-09-02: 12.5 mg via INTRAVENOUS
  Filled 2017-09-02: qty 1

## 2017-09-02 MED ORDER — METOCLOPRAMIDE HCL 5 MG/ML IJ SOLN
10.0000 mg | Freq: Once | INTRAMUSCULAR | Status: AC
  Administered 2017-09-02: 10 mg via INTRAVENOUS
  Filled 2017-09-02: qty 2

## 2017-09-02 MED ORDER — POTASSIUM CHLORIDE CRYS ER 20 MEQ PO TBCR
40.0000 meq | EXTENDED_RELEASE_TABLET | Freq: Once | ORAL | Status: AC
  Administered 2017-09-02: 40 meq via ORAL
  Filled 2017-09-02: qty 2

## 2017-09-02 NOTE — ED Triage Notes (Signed)
Pt c/o L frontal HA onset yesterday, pt states he woke up @ 0200 with blurred vision in L eye. Pt reports a feeling of "heaviness" on L side of face.  Pt feels L arm and leg feel "sluggish" No facial droop noted no drift noted

## 2017-09-02 NOTE — ED Provider Notes (Signed)
[facility] EMERGENCY DEPARTMENT Provider Note   CSN: [id number] Arrival date & time: 09/02/17  0252     History   Chief Complaint Chief Complaint  Patient presents with  . Headache    HPI Travis Hatfield. is a 37 y.o. male with a hx of tonsillectomy presents to the Emergency Department complaining of constant, gradual, persistent, progressively worsening headache onset 2pm after eating a pot pie. Pt reports taking 2 tabs of Tylenol at 9:30PM without relief.  Pt reports the headache woke him from sleep at 2am.  Associated symptoms include "numbness and tingling" on the left side of his face with some left side blurring of his vision.  Pt denies slurred speech, gait disturbance, weakness in any extremity.  Pt reports he intermittently gets headaches, but has no known triggers.  No alleviating factors, no aggravating factors.  Pt denies photophobia or phonophobia.  Pt denies fever, chills, neck pain, chest pain, SOB, abd pain, N/V/D, weakness, dizziness, syncope.  Pt denies known trauma, falls, sick, contacts.       The history is provided by the patient and medical records. No language interpreter was used.    History reviewed. No pertinent past medical history.  There are no active problems to display for this patient.   Past Surgical History:  Procedure Laterality Date  . HERNIA REPAIR     at 11 years  . TONSILLECTOMY          Home Medications    Prior to Admission medications   Medication Sig Start Date End Date Taking? Authorizing Provider  amoxicillin (AMOXIL) 500 MG capsule Take 500 mg by mouth 3 (three) times daily.   Yes [provider]  diphenhydramine-acetaminophen (TYLENOL PM) 25-500 MG TABS tablet Take 2 tablets by mouth at bedtime as needed (pain/sleep).   Yes [provider]  ibuprofen (ADVIL,MOTRIN) 600 MG tablet Take 600-1,200 mg by mouth every 6 (six) hours as needed for mild pain.   Yes [provider]    ondansetron (ZOFRAN) 4 MG tablet Take 1 tablet (4 mg total) by mouth every 8 (eight) hours as needed for nausea or vomiting. 08/11/17  Yes Tegeler, Canary Brim, MD    Family History No family history on file.  Social History Social History   Tobacco Use  . Smoking status: Current Every Day Smoker    Types: Cigarettes  . Smokeless tobacco: Never Used  Substance Use Topics  . Alcohol use: No  . Drug use: No     Allergies   Patient has no known allergies.   Review of Systems Review of Systems  Constitutional: Negative for appetite change, diaphoresis, fatigue, fever and unexpected weight change.  HENT: Negative for mouth sores.   Eyes: Positive for visual disturbance.  Respiratory: Negative for cough, chest tightness, shortness of breath and wheezing.   Cardiovascular: Negative for chest pain.  Gastrointestinal: Negative for abdominal pain, constipation, diarrhea, nausea and vomiting.  Endocrine: Negative for polydipsia, polyphagia and polyuria.  Genitourinary: Negative for dysuria, frequency, hematuria and urgency.  Musculoskeletal: Negative for back pain and neck stiffness.  Skin: Negative for rash.  Allergic/Immunologic: Negative for immunocompromised state.  Neurological: Positive for light-headedness, numbness (facial) and headaches. Negative for syncope.  Hematological: Does not bruise/bleed easily.  Psychiatric/Behavioral: Negative for sleep disturbance. The patient is not nervous/anxious.      Physical Exam Updated Vital Signs BP (!) 151/90 (BP Location: Right Arm)   Pulse 84   Temp 98.4 F (36.9 C) (Oral)  Resp 18   Ht 5\' 3"  (1.6 m)   Wt 113.4 kg (250 lb)   SpO2 99%   BMI 44.29 kg/m   Physical Exam  Constitutional: He is oriented to person, place, and time. He appears well-developed and well-nourished. No distress.  HENT:  Head: Normocephalic and atraumatic.  Mouth/Throat: Oropharynx is clear and moist.  Eyes: Pupils are equal, round, and reactive  to light. Conjunctivae and EOM are normal. No scleral icterus.  No horizontal, vertical or rotational nystagmus  Neck: Normal range of motion. Neck supple.  Full active and passive ROM without pain No midline or paraspinal tenderness No nuchal rigidity or meningeal signs  Cardiovascular: Normal rate, regular rhythm and intact distal pulses.  Pulmonary/Chest: Effort normal and breath sounds normal. No respiratory distress. He has no wheezes. He has no rales.  Abdominal: Soft. Bowel sounds are normal. There is no tenderness. There is no rebound and no guarding.  Musculoskeletal: Normal range of motion.  Lymphadenopathy:    He has no cervical adenopathy.  Neurological: He is alert and oriented to person, place, and time. No cranial nerve deficit. He exhibits normal muscle tone. Coordination normal.  Mental Status:  Alert, oriented, thought content appropriate. Speech fluent without evidence of aphasia. Able to follow 2 step commands without difficulty.  Cranial Nerves:  II:  Peripheral visual fields grossly normal, pupils equal, round, reactive to light III,IV, VI: ptosis not present, extra-ocular motions intact bilaterally  V,VII: smile symmetric, facial light touch sensation equal VIII: hearing grossly normal bilaterally  IX,X: midline uvula rise  XI: bilateral shoulder shrug equal and strong XII: midline tongue extension  Motor:  5/5 in upper and lower extremities bilaterally including strong and equal grip strength and dorsiflexion/plantar flexion Sensory: light touch normal in all extremities; subjective decrease in sensation to the left side of the face  Cerebellar: normal finger-to-nose with bilateral upper extremities Gait: normal gait and balance CV: distal pulses palpable throughout   Skin: Skin is warm and dry. No rash noted. He is not diaphoretic.  Psychiatric: He has a normal mood and affect. His behavior is normal. Judgment and thought content normal.  Nursing note and  vitals reviewed.    ED Treatments / Results  Labs (all labs ordered are listed, but only abnormal results are displayed) Labs Reviewed  COMPREHENSIVE METABOLIC PANEL - Abnormal; Notable for the following components:      Result Value   Glucose, Bld 115 (*)    Calcium 8.5 (*)    Total Protein 5.3 (*)    Albumin 3.2 (*)    All other components within normal limits  I-STAT CHEM 8, ED - Abnormal; Notable for the following components:   Potassium 3.4 (*)    BUN 5 (*)    Glucose, Bld 108 (*)    Calcium, Ion 1.11 (*)    All other components within normal limits  ETHANOL  PROTIME-INR  APTT  CBC  DIFFERENTIAL  RAPID URINE DRUG SCREEN, HOSP PERFORMED  URINALYSIS, ROUTINE W REFLEX MICROSCOPIC  I-STAT TROPONIN, ED    EKG EKG Interpretation  Date/Time:  Tuesday September 02 2017 04:35:09 EDT Ventricular Rate:  67 PR Interval:    QRS Duration: 91 QT Interval:  363 QTC Calculation: 384 R Axis:   65 Text Interpretation:  Sinus rhythm Confirmed by Palumbo, April ([id number]) on 09/02/2017 5:06:58 AM   Radiology Ct Head Wo Contrast  Result Date: 09/02/2017 CLINICAL DATA:  37 year old male with headache and blurry vision. EXAM: CT HEAD WITHOUT CONTRAST  TECHNIQUE: Contiguous axial images were obtained from the base of the skull through the vertex without intravenous contrast. COMPARISON:  Head CT dated 02/21/2017 FINDINGS: Brain: No evidence of acute infarction, hemorrhage, hydrocephalus, extra-axial collection or mass lesion/mass effect. Vascular: No hyperdense vessel or unexpected calcification. Skull: Normal. Negative for fracture or focal lesion. Sinuses/Orbits: No acute finding. Other: Focal area of scalp contusion over the vertex, chronic. IMPRESSION: Unremarkable noncontrast CT of the brain. Electronically Signed   By: Elgie Collard M.D.   On: 09/02/2017 04:28    Procedures Procedures (including critical care time)  Medications Ordered in ED Medications  ketorolac (TORADOL) 30 MG/ML  injection 30 mg (30 mg Intravenous Given 09/02/17 0453)  metoCLOPramide (REGLAN) injection 10 mg (10 mg Intravenous Given 09/02/17 0453)  diphenhydrAMINE (BENADRYL) injection 12.5 mg (12.5 mg Intravenous Given 09/02/17 0453)  potassium chloride SA (K-DUR,KLOR-CON) CR tablet 40 mEq (40 mEq Oral Given 09/02/17 4098)     Initial Impression / Assessment and Plan / ED Course  I have reviewed the triage vital signs and the nursing notes.  Pertinent labs & imaging results that were available during my care of the patient were reviewed by me and considered in my medical decision making (see chart for details).  Clinical Course as of Sep 02 700  Tue Sep 02, 2017  1191 Pt reports improvement in headache and symptoms.    [HM]  0654 BP improved without intervention  BP: 110/61 [HM]  0654 Mild.  Oral potassium given here.    Potassium(!): 3.4 [HM]  0659 For complete resolution of facial paresthesias.  He ambulates here in the emergency department with steady gait.  No slurred speech.  No further focal symptoms.   [HM]    Clinical Course User Index [HM] Ova Meegan, Dahlia Client, PA-C   Patient with gradual onset headache.  Labs reassuring.  No chest pain or shortness of breath.  Normal neurologic exam on my initial evaluation.  CT head without acute abnormality.  I personally evaluated these images.  Will treat as complex migraine and reevaluate.  7:02 AM Pt HA treated and improved while in ED. Complete resolution of his facial paresthesias.  Normal gait and normal repeat neuro exam.  Presentation is non concerning for [facility], ICH, Meningitis, or temporal arteritis. Pt is afebrile with no focal neuro deficits, nuchal rigidity, or change in vision. Pt will be referred to neurology. Pt verbalizes understanding and is agreeable with plan to dc.    Final Clinical Impressions(s) / ED Diagnoses   Final diagnoses:  Bad headache  Paresthesias  Hypokalemia    ED Discharge Orders    None       Mardene Sayer  Boyd Kerbs 09/02/17 4782    Palumbo, April, MD 09/02/17 [phone]

## 2017-09-02 NOTE — Discharge Instructions (Addendum)
1. Medications: usual home medications 2. Treatment: rest, drink plenty of fluids,  3. Follow Up: Please followup with your primary doctor and neurology in 2-3 days for discussion of your diagnoses and further evaluation after today's visit; if you do not have a primary care doctor use the resource guide provided to find one; Please return to the ER for worsening symptoms, fevers, speech difficulty, difficulty walking or other concerns

## 2017-09-02 NOTE — ED Notes (Signed)
Patient left at this time with all belongings. 

## 2018-01-22 ENCOUNTER — Other Ambulatory Visit: Payer: Self-pay

## 2018-01-22 ENCOUNTER — Encounter (HOSPITAL_COMMUNITY): Payer: Self-pay | Admitting: *Deleted

## 2018-01-22 ENCOUNTER — Emergency Department (HOSPITAL_COMMUNITY)
Admission: EM | Admit: 2018-01-22 | Discharge: 2018-01-22 | Disposition: A | Discharge status: Home / Self Care | Payer: Self-pay | Attending: Emergency Medicine | Admitting: Emergency Medicine

## 2018-01-22 DIAGNOSIS — M62830 Muscle spasm of back: Secondary | ICD-10-CM | POA: Insufficient documentation

## 2018-01-22 DIAGNOSIS — Z87891 Personal history of nicotine dependence: Secondary | ICD-10-CM | POA: Insufficient documentation

## 2018-01-22 LAB — URINALYSIS, ROUTINE W REFLEX MICROSCOPIC
Bilirubin Urine: NEGATIVE
Glucose, UA: NEGATIVE mg/dL
HGB URINE DIPSTICK: NEGATIVE
Ketones, ur: NEGATIVE mg/dL
Leukocytes, UA: NEGATIVE
NITRITE: NEGATIVE
Protein, ur: NEGATIVE mg/dL
SPECIFIC GRAVITY, URINE: 1.015 (ref 1.005–1.030)
pH: 5 (ref 5.0–8.0)

## 2018-01-22 MED ORDER — NAPROXEN 375 MG PO TABS: 375.0000 mg | ORAL_TABLET | Freq: Two times a day (BID) | ORAL | 0 refills | Status: DC

## 2018-01-22 MED ORDER — KETOROLAC TROMETHAMINE 60 MG/2ML IM SOLN
30.0000 mg | Freq: Once | INTRAMUSCULAR | Status: AC
  Administered 2018-01-22: 30 mg via INTRAMUSCULAR
  Filled 2018-01-22: qty 2

## 2018-01-22 MED ORDER — CYCLOBENZAPRINE HCL 10 MG PO TABS: 10.0000 mg | ORAL_TABLET | Freq: Two times a day (BID) | ORAL | 0 refills | Status: DC | PRN

## 2018-01-22 NOTE — ED Triage Notes (Signed)
Pt c/o lower left sided back pain for the past few days. Denies injury. Reports urinary urgency and frequency.

## 2018-01-22 NOTE — ED Provider Notes (Signed)
[facility] EMERGENCY DEPARTMENT Provider Note   CSN: [id number] Arrival date & time: 01/22/18  2045     History   Chief Complaint Chief Complaint  Patient presents with  . Back Pain    HPI Travis Hatfield. is a 37 y.o. male.  Patient presents with left sided low back/flank pain onset three days ago. Pain is worse with movement and twisting. 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 No known injury.  The history is provided by the patient. No language interpreter was used.  Back Pain   The current episode started more than 2 days ago. The problem occurs daily. The pain is associated with no known injury. Pain location: left flank. The pain does not radiate. The pain is moderate. The symptoms are aggravated by twisting and certain positions. Pertinent negatives include no fever, no bowel incontinence, no bladder incontinence, no dysuria and no leg pain.    History reviewed. No pertinent past medical history.  There are no active problems to display for this patient.   Past Surgical History:  Procedure Laterality Date  . HERNIA REPAIR     at 11 years  . TONSILLECTOMY          Home Medications    Prior to Admission medications   Medication Sig Start Date End Date Taking? Authorizing Provider  amoxicillin (AMOXIL) 500 MG capsule Take 500 mg by mouth 3 (three) times daily.    [provider]  diphenhydramine-acetaminophen (TYLENOL PM) 25-500 MG TABS tablet Take 2 tablets by mouth at bedtime as needed (pain/sleep).    [provider]  ibuprofen (ADVIL,MOTRIN) 600 MG tablet Take 600-1,200 mg by mouth every 6 (six) hours as needed for mild pain.    [provider]  ondansetron (ZOFRAN) 4 MG tablet Take 1 tablet (4 mg total) by mouth every 8 (eight) hours as needed for nausea or vomiting. 08/11/17   Tegeler, Canary Brim, MD    Family History No family history on file.  Social History Social History    Tobacco Use  . Smoking status: Former Smoker    Types: Cigarettes  . Smokeless tobacco: Never Used  Substance Use Topics  . Alcohol use: No  . Drug use: No     Allergies   Patient has no known allergies.   Review of Systems Review of Systems  Constitutional: Negative for fever.  Gastrointestinal: Negative for bowel incontinence.  Genitourinary: Negative for bladder incontinence and dysuria.  Musculoskeletal: Positive for back pain.  All other systems reviewed and are negative.    Physical Exam Updated Vital Signs BP (!) 148/85   Pulse 89   Temp 98.9 F (37.2 C) (Oral)   Resp 16   SpO2 98%   Physical Exam  Constitutional: He is oriented to person, place, and time. He appears well-developed and well-nourished.  HENT:  Head: Normocephalic.  Eyes: EOM are normal.  Neck: Neck supple.  Cardiovascular: Normal rate and regular rhythm.  Pulmonary/Chest: Effort normal and breath sounds normal.  Abdominal: Soft. Bowel sounds are normal.  Musculoskeletal: Normal range of motion. He exhibits tenderness. He exhibits no edema.       Lumbar back: He exhibits pain and spasm.       Back:  Neurological: He is alert and oriented to person, place, and time.  Skin: Skin is warm and dry.  Psychiatric: He has a normal mood and affect.  Nursing note and vitals reviewed.    ED  Treatments / Results  Labs (all labs ordered are listed, but only abnormal results are displayed) Labs Reviewed  URINALYSIS, ROUTINE W REFLEX MICROSCOPIC    EKG None  Radiology No results found.  Procedures Procedures (including critical care time)  Medications Ordered in ED Medications  ketorolac (TORADOL) injection 30 mg (30 mg Intramuscular Given 01/22/18 2231)     Initial Impression / Assessment and Plan / ED Course  I have reviewed the triage vital signs and the nursing notes.  Pertinent labs & imaging results that were available during my care of the patient were reviewed by me and  considered in my medical decision making (see chart for details).     Patient with back pain.  No neurological deficits and normal neuro exam.  Patient is ambulatory.  No loss of bowel or bladder control.  No concern for cauda equina.  No fever, night sweats, weight loss, h/o cancer, IVDA, no recent procedure to back. No urinary symptoms suggestive of UTI.  Supportive care and return precaution discussed. Appears safe for discharge at this time. Follow up as indicated in discharge paperwork.   Final Clinical Impressions(s) / ED Diagnoses   Final diagnoses:  Muscle spasm of back    ED Discharge Orders         Ordered    naproxen (NAPROSYN) 375 MG tablet  2 times daily     01/22/18 2206    cyclobenzaprine (FLEXERIL) 10 MG tablet  2 times daily PRN     01/22/18 2206           Felicie MornSmith, Cressida Milford, NP 01/23/18 0000    Wynetta FinesMessick, Peter C, MD 01/23/18 [phone]

## 2018-02-25 ENCOUNTER — Encounter (HOSPITAL_COMMUNITY): Payer: Self-pay | Admitting: Emergency Medicine

## 2018-02-25 ENCOUNTER — Emergency Department (HOSPITAL_COMMUNITY)
Admission: EM | Admit: 2018-02-25 | Discharge: 2018-02-25 | Disposition: A | Discharge status: Home / Self Care | Payer: Self-pay | Attending: Emergency Medicine | Admitting: Emergency Medicine

## 2018-02-25 ENCOUNTER — Emergency Department (HOSPITAL_COMMUNITY): Payer: Self-pay

## 2018-02-25 DIAGNOSIS — Z87891 Personal history of nicotine dependence: Secondary | ICD-10-CM | POA: Insufficient documentation

## 2018-02-25 DIAGNOSIS — K529 Noninfective gastroenteritis and colitis, unspecified: Secondary | ICD-10-CM | POA: Insufficient documentation

## 2018-02-25 DIAGNOSIS — E860 Dehydration: Secondary | ICD-10-CM | POA: Insufficient documentation

## 2018-02-25 LAB — CBC WITH DIFFERENTIAL/PLATELET
Abs Immature Granulocytes: 0 10*3/uL (ref 0.0–0.1)
BASOS ABS: 0 10*3/uL (ref 0.0–0.1)
BASOS PCT: 0 %
EOS PCT: 2 %
Eosinophils Absolute: 0.2 10*3/uL (ref 0.0–0.7)
HEMATOCRIT: 49.1 % (ref 39.0–52.0)
Hemoglobin: 15.9 g/dL (ref 13.0–17.0)
Immature Granulocytes: 0 %
Lymphocytes Relative: 17 %
Lymphs Abs: 1.6 10*3/uL (ref 0.7–4.0)
MCH: 27.1 pg (ref 26.0–34.0)
MCHC: 32.4 g/dL (ref 30.0–36.0)
MCV: 83.6 fL (ref 78.0–100.0)
Monocytes Absolute: 1 10*3/uL (ref 0.1–1.0)
Monocytes Relative: 12 %
Neutro Abs: 6.2 10*3/uL (ref 1.7–7.7)
Neutrophils Relative %: 69 %
Platelets: 252 10*3/uL (ref 150–400)
RBC: 5.87 MIL/uL — AB (ref 4.22–5.81)
RDW: 12.9 % (ref 11.5–15.5)
WBC: 9 10*3/uL (ref 4.0–10.5)

## 2018-02-25 LAB — COMPREHENSIVE METABOLIC PANEL
ALT: 18 U/L (ref 0–44)
AST: 19 U/L (ref 15–41)
Albumin: 3.8 g/dL (ref 3.5–5.0)
Alkaline Phosphatase: 55 U/L (ref 38–126)
Anion gap: 8 (ref 5–15)
BILIRUBIN TOTAL: 0.6 mg/dL (ref 0.3–1.2)
BUN: 7 mg/dL (ref 6–20)
CALCIUM: 8.6 mg/dL — AB (ref 8.9–10.3)
CO2: 25 mmol/L (ref 22–32)
CREATININE: 1.05 mg/dL (ref 0.61–1.24)
Chloride: 106 mmol/L (ref 98–111)
Glucose, Bld: 108 mg/dL — ABNORMAL HIGH (ref 70–99)
Potassium: 4 mmol/L (ref 3.5–5.1)
Sodium: 139 mmol/L (ref 135–145)
TOTAL PROTEIN: 6.6 g/dL (ref 6.5–8.1)

## 2018-02-25 LAB — URINALYSIS, ROUTINE W REFLEX MICROSCOPIC
Bilirubin Urine: NEGATIVE
Glucose, UA: NEGATIVE mg/dL
Hgb urine dipstick: NEGATIVE
Ketones, ur: NEGATIVE mg/dL
Leukocytes, UA: NEGATIVE
NITRITE: NEGATIVE
PH: 5 (ref 5.0–8.0)
Protein, ur: NEGATIVE mg/dL
Specific Gravity, Urine: 1.021 (ref 1.005–1.030)

## 2018-02-25 LAB — I-STAT CG4 LACTIC ACID, ED
LACTIC ACID, VENOUS: 1.04 mmol/L (ref 0.5–1.9)
Lactic Acid, Venous: 1.98 mmol/L — ABNORMAL HIGH (ref 0.5–1.9)

## 2018-02-25 LAB — LIPASE, BLOOD: Lipase: 41 U/L (ref 11–51)

## 2018-02-25 MED ORDER — ONDANSETRON HCL 4 MG PO TABS: 4.0000 mg | ORAL_TABLET | Freq: Three times a day (TID) | ORAL | 0 refills | Status: DC | PRN

## 2018-02-25 MED ORDER — ONDANSETRON HCL 4 MG/2ML IJ SOLN
4.0000 mg | Freq: Once | INTRAMUSCULAR | Status: AC
  Administered 2018-02-25: 4 mg via INTRAVENOUS
  Filled 2018-02-25: qty 2

## 2018-02-25 MED ORDER — ACETAMINOPHEN 500 MG PO TABS
1000.0000 mg | ORAL_TABLET | Freq: Once | ORAL | Status: AC
  Administered 2018-02-25: 1000 mg via ORAL
  Filled 2018-02-25: qty 2

## 2018-02-25 MED ORDER — SODIUM CHLORIDE 0.9 % IV BOLUS
1000.0000 mL | Freq: Once | INTRAVENOUS | Status: AC
  Administered 2018-02-25: 1000 mL via INTRAVENOUS

## 2018-02-25 NOTE — ED Provider Notes (Signed)
[facility] EMERGENCY DEPARTMENT Provider Note   CSN: [id number] Arrival date & time: 02/25/18  1452     History   Chief Complaint Chief Complaint  Patient presents with  . Fever  . Tachycardia  . Diarrhea    HPI Travis Hatfield. is a 37 y.o. male.  HPI Patient developed vomiting and diarrhea that started yesterday evening.  He has had roughly 3-4 episodes of each.  No grossly bloody or melanotic stools.  Complains of diffuse myalgias.  Went to give plasma this morning and was refused due to tachycardia and fever.  States his children have been sick for over the last 2 days with vomiting and diarrhea.  No recent foreign travel.  Patient is primarily concerned with his tachycardia. History reviewed. No pertinent past medical history.  There are no active problems to display for this patient.   Past Surgical History:  Procedure Laterality Date  . HERNIA REPAIR     at 11 years  . TONSILLECTOMY          Home Medications    Prior to Admission medications   Medication Sig Start Date End Date Taking? Authorizing Provider  cyclobenzaprine (FLEXERIL) 10 MG tablet Take 1 tablet (10 mg total) by mouth 2 (two) times daily as needed for muscle spasms. 01/22/18  Yes Felicie Morn, NP  diphenhydramine-acetaminophen (TYLENOL PM) 25-500 MG TABS tablet Take 2 tablets by mouth at bedtime as needed (pain/sleep).   Yes [provider]  ibuprofen (ADVIL,MOTRIN) 200 MG tablet Take 800 mg by mouth every 6 (six) hours as needed (for pain).   Yes [provider]  naproxen (NAPROSYN) 375 MG tablet Take 1 tablet (375 mg total) by mouth 2 (two) times daily. Patient not taking: Reported on 02/25/2018 01/22/18   Felicie Morn, NP  ondansetron [facility]) 4 MG tablet Take 1 tablet (4 mg total) by mouth every 8 (eight) hours as needed for nausea or vomiting. 02/25/18   Loren Racer, MD    Family History History reviewed. No pertinent family history.  Social  History Social History   Tobacco Use  . Smoking status: Former Smoker    Types: Cigarettes  . Smokeless tobacco: Never Used  Substance Use Topics  . Alcohol use: No  . Drug use: No     Allergies   Patient has no known allergies.   Review of Systems Review of Systems  Constitutional: Positive for chills, fatigue and fever.  HENT: Negative for sore throat and trouble swallowing.   Respiratory: Negative for cough and shortness of breath.   Cardiovascular: Negative for chest pain.  Gastrointestinal: Positive for diarrhea, nausea and vomiting. Negative for abdominal pain, blood in stool and constipation.  Genitourinary: Negative for dysuria, flank pain and frequency.  Musculoskeletal: Positive for myalgias. Negative for back pain, neck pain and neck stiffness.  Skin: Negative for rash and wound.  Neurological: Negative for dizziness, weakness, light-headedness, numbness and headaches.  All other systems reviewed and are negative.    Physical Exam Updated Vital Signs BP 123/82   Pulse (!) 101   Temp 99.7 F (37.6 C) (Oral)   Resp 18   Ht 5\' 6"  (1.676 m)   Wt 120.2 kg   SpO2 95%   BMI 42.77 kg/m   Physical Exam  Constitutional: He is oriented to person, place, and time. He appears well-developed and well-nourished. No distress.  HENT:  Head: Normocephalic and atraumatic.  Mouth/Throat: Oropharynx is clear and moist. No oropharyngeal exudate.  Eyes:  Pupils are equal, round, and reactive to light. EOM are normal.  Neck: Normal range of motion. Neck supple. No JVD present.  Cardiovascular: Regular rhythm.  Tachycardia  Pulmonary/Chest: Effort normal and breath sounds normal. No stridor. No respiratory distress. He has no wheezes. He has no rales. He exhibits no tenderness.  Abdominal: Soft. Bowel sounds are normal. There is no tenderness. There is no rebound and no guarding.  Nontender abdominal exam.  Musculoskeletal: Normal range of motion. He exhibits no edema or  tenderness.  No midline thoracic or lumbar tenderness.  No CVA tenderness bilaterally.  No lower extremity swelling, asymmetry or tenderness.  Lymphadenopathy:    He has no cervical adenopathy.  Neurological: He is alert and oriented to person, place, and time.  Moves all extremities without focal deficit.  Sensation fully intact.  Skin: Skin is warm and dry. Capillary refill takes less than 2 seconds. No rash noted. He is not diaphoretic. No erythema.  Psychiatric: His behavior is normal.  Mildly anxious appearing  Nursing note and vitals reviewed.    ED Treatments / Results  Labs (all labs ordered are listed, but only abnormal results are displayed) Labs Reviewed  COMPREHENSIVE METABOLIC PANEL - Abnormal; Notable for the following components:      Result Value   Glucose, Bld 108 (*)    Calcium 8.6 (*)    All other components within normal limits  CBC WITH DIFFERENTIAL/PLATELET - Abnormal; Notable for the following components:   RBC 5.87 (*)    All other components within normal limits  I-STAT CG4 LACTIC ACID, ED - Abnormal; Notable for the following components:   Lactic Acid, Venous 1.98 (*)    All other components within normal limits  URINALYSIS, ROUTINE W REFLEX MICROSCOPIC  LIPASE, BLOOD  I-STAT CG4 LACTIC ACID, ED    EKG EKG Interpretation  Date/Time:  Wednesday February 25 2018 15:07:03 EDT Ventricular Rate:  119 PR Interval:  134 QRS Duration: 76 QT Interval:  302 QTC Calculation: 424 R Axis:   89 Text Interpretation:  Sinus tachycardia Possible Left atrial enlargement Nonspecific ST and T wave abnormality Abnormal ECG Confirmed by Loren Racer ([id number]) on 02/25/2018 9:22:59 PM   Radiology Dg Chest 2 View  Result Date: 02/25/2018 CLINICAL DATA:  Patient reports NVD since yesterday. Vomited 3 times, 1 occasion he noticed some blood in it. No blood seen in stool. Fever earlier today of 102 degrees. Denies SOB, some center chest pains last night. Ex smoker  quit 9 months ago.*comment was truncated* EXAM: CHEST - 2 VIEW COMPARISON:  02/21/17 FINDINGS: Normal mediastinum and cardiac silhouette. Normal pulmonary vasculature. No evidence of effusion, infiltrate, or pneumothorax. No acute bony abnormality. IMPRESSION: No acute cardiopulmonary process. Electronically Signed   By: Genevive Bi M.D.   On: 02/25/2018 16:09    Procedures Procedures (including critical care time)  Medications Ordered in ED Medications  sodium chloride 0.9 % bolus 1,000 mL (0 mLs Intravenous Stopped 02/25/18 1849)  ondansetron (ZOFRAN) injection 4 mg (4 mg Intravenous Given 02/25/18 1720)  acetaminophen (TYLENOL) tablet 1,000 mg (1,000 mg Oral Given 02/25/18 1720)  sodium chloride 0.9 % bolus 1,000 mL (0 mLs Intravenous Stopped 02/25/18 2057)     Initial Impression / Assessment and Plan / ED Course  I have reviewed the triage vital signs and the nursing notes.  Pertinent labs & imaging results that were available during my care of the patient were reviewed by me and considered in my medical decision making (see chart for  details).     Tachycardia is nearly resolved with IV fluids.  Mild elevation in lactic acid is also resolved.  Symptoms likely related to dehydration from gastroenteritis.  Has not had any chest pain at any point.  Return precautions given. Final Clinical Impressions(s) / ED Diagnoses   Final diagnoses:  Dehydration  Gastroenteritis    ED Discharge Orders         Ordered    ondansetron (ZOFRAN) 4 MG tablet  Every 8 hours PRN     02/25/18 2125           Loren Racer, MD 02/25/18 2125

## 2018-02-25 NOTE — ED Triage Notes (Signed)
Pt presents with diarrhea, tachycardia, and fever that began yesterday; pt states he went to donate plasma and they refused and told him to seek treatment

## 2018-02-25 NOTE — ED Provider Notes (Signed)
Patient placed in Quick Look pathway, seen and evaluated   Chief Complaint: Fever, tachycardia, N/V/D  HPI:   Lindell SparRoy L Jenning Jr. is a 37 y.o. male who is otherwise healthy presents to ED for evaluation of fever, and elevated HR. Reports he started feeling unwell yesterday with nausea, vomiting and diarrhea. Went to donate plasma today and was turned away due to fever of 102 and HR of 120. Pt reports his children are sick with similar symptoms, but less severe. Not having abd pain today, but still N/V/D. No cough, CP, or SOB, mild rhinorrhea  ROS: + fever, N/V/D - abd pain, cough, CP, SOB  Physical Exam:   Gen: No distress  Neuro: Awake and Alert  Skin: Warm    Focused Exam: Mild tenderness over diffuse abdomen, no guarding or rebound, no rigidity. Tachy w/ regular rhythm, lungs CTA bilat   Initiation of care has begun. The patient has been counseled on the process, plan, and necessity for staying for the completion/evaluation, and the remainder of the medical screening examination    Legrand RamsFord, Arlin Sass N, PA-C 02/25/18 1536    Derwood KaplanNanavati, Ankit, MD 02/27/18 1537

## 2018-03-01 ENCOUNTER — Emergency Department (HOSPITAL_COMMUNITY)
Admission: EM | Admit: 2018-03-01 | Discharge: 2018-03-01 | Disposition: A | Discharge status: Home / Self Care | Payer: Self-pay | Attending: Emergency Medicine | Admitting: Emergency Medicine

## 2018-03-01 ENCOUNTER — Other Ambulatory Visit: Payer: Self-pay

## 2018-03-01 ENCOUNTER — Emergency Department (HOSPITAL_COMMUNITY): Payer: Self-pay

## 2018-03-01 ENCOUNTER — Encounter (HOSPITAL_COMMUNITY): Payer: Self-pay

## 2018-03-01 DIAGNOSIS — R0602 Shortness of breath: Secondary | ICD-10-CM | POA: Insufficient documentation

## 2018-03-01 DIAGNOSIS — Z5321 Procedure and treatment not carried out due to patient leaving prior to being seen by health care provider: Secondary | ICD-10-CM | POA: Insufficient documentation

## 2018-03-01 LAB — CBC
HCT: 46.4 % (ref 39.0–52.0)
Hemoglobin: 15 g/dL (ref 13.0–17.0)
MCH: 27.2 pg (ref 26.0–34.0)
MCHC: 32.3 g/dL (ref 30.0–36.0)
MCV: 84.2 fL (ref 78.0–100.0)
PLATELETS: 251 10*3/uL (ref 150–400)
RBC: 5.51 MIL/uL (ref 4.22–5.81)
RDW: 12.8 % (ref 11.5–15.5)
WBC: 5.1 10*3/uL (ref 4.0–10.5)

## 2018-03-01 LAB — BASIC METABOLIC PANEL
ANION GAP: 10 (ref 5–15)
BUN: 7 mg/dL (ref 6–20)
CALCIUM: 8.6 mg/dL — AB (ref 8.9–10.3)
CO2: 22 mmol/L (ref 22–32)
CREATININE: 1.04 mg/dL (ref 0.61–1.24)
Chloride: 107 mmol/L (ref 98–111)
GFR calc Af Amer: >60 mL/min (ref >60)
GLUCOSE: 137 mg/dL — AB (ref 70–99)
Potassium: 3.3 mmol/L — ABNORMAL LOW (ref 3.5–5.1)
Sodium: 139 mmol/L (ref 135–145)

## 2018-03-01 NOTE — ED Notes (Signed)
Patient and family member left at this time.  They did not stop and speak to anyone.

## 2018-03-01 NOTE — ED Notes (Signed)
Daughter at the desk requesting patient be seen now.  Reviewed vitals.  Patient remains stable.  Tech to recheck vitals at this time.  Encouraged to wait to be seen.

## 2018-03-01 NOTE — ED Triage Notes (Signed)
Pt states he has been short of breath since 02/28/18 when he went to bed.  Lungs clear able to talk in complete sentences. A&Ox4.

## 2018-03-03 ENCOUNTER — Other Ambulatory Visit: Payer: Self-pay

## 2018-03-03 ENCOUNTER — Emergency Department (HOSPITAL_COMMUNITY)
Admission: EM | Admit: 2018-03-03 | Discharge: 2018-03-03 | Disposition: A | Discharge status: Home / Self Care | Payer: Self-pay | Attending: Emergency Medicine | Admitting: Emergency Medicine

## 2018-03-03 ENCOUNTER — Encounter (HOSPITAL_COMMUNITY): Payer: Self-pay | Admitting: Emergency Medicine

## 2018-03-03 ENCOUNTER — Emergency Department (HOSPITAL_COMMUNITY): Payer: Self-pay

## 2018-03-03 DIAGNOSIS — R0602 Shortness of breath: Secondary | ICD-10-CM

## 2018-03-03 DIAGNOSIS — J4531 Mild persistent asthma with (acute) exacerbation: Secondary | ICD-10-CM | POA: Insufficient documentation

## 2018-03-03 DIAGNOSIS — J4521 Mild intermittent asthma with (acute) exacerbation: Secondary | ICD-10-CM

## 2018-03-03 HISTORY — DX: Unspecified asthma, uncomplicated: J45.909

## 2018-03-03 MED ORDER — ALBUTEROL SULFATE HFA 108 (90 BASE) MCG/ACT IN AERS
1.0000 | INHALATION_SPRAY | Freq: Once | RESPIRATORY_TRACT | Status: AC
  Administered 2018-03-03: 2 via RESPIRATORY_TRACT
  Filled 2018-03-03: qty 6.7

## 2018-03-03 MED ORDER — ALBUTEROL SULFATE HFA 108 (90 BASE) MCG/ACT IN AERS: 1.0000 | INHALATION_SPRAY | Freq: Four times a day (QID) | RESPIRATORY_TRACT | 0 refills | Status: DC | PRN

## 2018-03-03 MED ORDER — PREDNISONE 20 MG PO TABS
60.0000 mg | ORAL_TABLET | Freq: Once | ORAL | Status: AC
  Administered 2018-03-03: 60 mg via ORAL
  Filled 2018-03-03: qty 3

## 2018-03-03 MED ORDER — IPRATROPIUM-ALBUTEROL 0.5-2.5 (3) MG/3ML IN SOLN
3.0000 mL | Freq: Once | RESPIRATORY_TRACT | Status: AC
  Administered 2018-03-03: 3 mL via RESPIRATORY_TRACT
  Filled 2018-03-03: qty 3

## 2018-03-03 NOTE — ED Provider Notes (Signed)
[facility] EMERGENCY DEPARTMENT Provider Note   CSN: [id number] Arrival date & time: 03/03/18  1536     History   Chief Complaint Chief Complaint  Patient presents with  . Asthma    HPI Travis Hatfield. is a 37 y.o. male.  HPI   37 year old male with a past medical history of asthma presents today with complaints of asthma exacerbation.  Patient notes recently he has had several exacerbations of his asthma, chest tightness and wheezing.  He notes is similar to previous exacerbations but has not had any recently.  Patient notes he came 2 days ago but left without being seen yesterday weight was significantly long.  He notes symptoms did improve.  He denies any upper respiratory congestion rhinorrhea cough.  He notes chest tightness and wheezing today, no chest pain.  Denies any history DVT or PE, no albuterol at home.  Past Medical History:  Diagnosis Date  . Asthma     There are no active problems to display for this patient.   Past Surgical History:  Procedure Laterality Date  . HERNIA REPAIR     at 11 years  . TONSILLECTOMY          Home Medications    Prior to Admission medications   Medication Sig Start Date End Date Taking? Authorizing Provider  cyclobenzaprine (FLEXERIL) 10 MG tablet Take 1 tablet (10 mg total) by mouth 2 (two) times daily as needed for muscle spasms. 01/22/18  Yes Felicie Morn, NP  diphenhydramine-acetaminophen (TYLENOL PM) 25-500 MG TABS tablet Take 2 tablets by mouth at bedtime as needed (pain/sleep).   Yes [provider]  ibuprofen (ADVIL,MOTRIN) 200 MG tablet Take 800 mg by mouth every 6 (six) hours as needed (for pain).   Yes [provider]  albuterol (PROVENTIL HFA;VENTOLIN HFA) 108 (90 Base) MCG/ACT inhaler Inhale 1-2 puffs into the lungs every 6 (six) hours as needed for wheezing or shortness of breath. 03/03/18   Karlin Heilman, Tinnie Gens, PA-C  naproxen (NAPROSYN) 375 MG tablet Take 1 tablet (375 mg total) by  mouth 2 (two) times daily. Patient not taking: Reported on 02/25/2018 01/22/18   Felicie Morn, NP  ondansetron (ZOFRAN) 4 MG tablet Take 1 tablet (4 mg total) by mouth every 8 (eight) hours as needed for nausea or vomiting. Patient not taking: Reported on 03/03/2018 02/25/18   Loren Racer, MD    Family History No family history on file.  Social History Social History   Tobacco Use  . Smoking status: Former Smoker    Types: Cigarettes  . Smokeless tobacco: Never Used  Substance Use Topics  . Alcohol use: No  . Drug use: No     Allergies   Patient has no known allergies.   Review of Systems Review of Systems  All other systems reviewed and are negative.    Physical Exam Updated Vital Signs BP 128/78   Pulse 84   Temp 98.7 F (37.1 C) (Oral)   Resp 18   Ht 5\' 6"  (1.676 m)   Wt 117.9 kg   SpO2 99%   BMI 41.97 kg/m   Physical Exam  Constitutional: He is oriented to person, place, and time. He appears well-developed and well-nourished.  HENT:  Head: Normocephalic and atraumatic.  Eyes: Pupils are equal, round, and reactive to light. Conjunctivae are normal. Right eye exhibits no discharge. Left eye exhibits no discharge. No scleral icterus.  Neck: Normal range of motion. No JVD present. No tracheal deviation present.  Pulmonary/Chest: Effort normal and breath sounds normal. No stridor. No respiratory distress. He has no wheezes. He has no rales. He exhibits no tenderness.  Musculoskeletal: He exhibits no edema.  Neurological: He is alert and oriented to person, place, and time. Coordination normal.  Psychiatric: He has a normal mood and affect. His behavior is normal. Judgment and thought content normal.  Nursing note and vitals reviewed.    ED Treatments / Results  Labs (all labs ordered are listed, but only abnormal results are displayed) Labs Reviewed - No data to display  EKG None  Radiology Dg Chest 2 View  Result Date: 03/03/2018 CLINICAL DATA:   Asthma attack. Chest tightness with shortness of breath 1.5 hours ago. EXAM: CHEST - 2 VIEW COMPARISON:  03/01/2018 and 02/25/2018 radiographs. FINDINGS: The heart size and mediastinal contours are normal. The lungs are clear. There is no pleural effusion or pneumothorax. No acute osseous findings are identified. IMPRESSION: Stable chest.  No active cardiopulmonary process. Electronically Signed   By: Carey Bullocks M.D.   On: 03/03/2018 17:06    Procedures Procedures (including critical care time)  Medications Ordered in ED Medications  ipratropium-albuterol (DUONEB) 0.5-2.5 (3) MG/3ML nebulizer solution 3 mL (3 mLs Nebulization Given 03/03/18 1620)  predniSONE (DELTASONE) tablet 60 mg (60 mg Oral Given 03/03/18 1620)  albuterol (PROVENTIL HFA;VENTOLIN HFA) 108 (90 Base) MCG/ACT inhaler 1-2 puff (2 puffs Inhalation Given 03/03/18 1835)     Initial Impression / Assessment and Plan / ED Course  I have reviewed the triage vital signs and the nursing notes.  Pertinent labs & imaging results that were available during my care of the patient were reviewed by me and considered in my medical decision making (see chart for details).     37 year old male presents today with likely asthma exacerbation.  She received breathing treatment prior to my evaluation which improved his symptoms.  Well-appearing no acute distress, clear lung sounds negative chest x-ray.  No signs or symptoms consistent with PE, no chest pain.  Patient discharged with albuterol for home, strict return precautions and outpatient follow-up.  He verbalized understanding and agreement to today's plan had no further questions or concerns.  Final Clinical Impressions(s) / ED Diagnoses   Final diagnoses:  Shortness of breath  Mild intermittent asthma with exacerbation    ED Discharge Orders         Ordered    albuterol (PROVENTIL HFA;VENTOLIN HFA) 108 (90 Base) MCG/ACT inhaler  Every 6 hours PRN     03/03/18 1830             Eyvonne Mechanic, PA-C 03/04/18 1227    Curatolo, Adam, DO 03/04/18 1455

## 2018-03-03 NOTE — ED Provider Notes (Signed)
Patient placed in Quick Look pathway, seen and evaluated   Chief Complaint: asthma attack, shortness of breath  HPI:  Travis Hatfield. is a 37 y.o. male with hx of asthma as a child. Pt reports feeling like he had an asthma attack. He was here 2 days ago but LWBS. Pt reports he does not have an inhaler. Pt breathing even and unlabored. Pt reports chest tightness today, no chest pain currently.   ROS: Resp: shortness of breath  Physical Exam:  BP 139/89 (BP Location: Right Arm)   Pulse 92   Temp 98.7 F (37.1 C) (Oral)   Resp 18   Ht 5\' 6"  (1.676 m)   Wt 117.9 kg   SpO2 94%   BMI 41.97 kg/m    Gen: No distress  Neuro: Awake and Alert  Lungs: decreased breath sounds bilateral.    Initiation of care has begun. The patient has been counseled on the process, plan, and necessity for staying for the completion/evaluation, and the remainder of the medical screening examination    Janne Napoleon, NP 03/03/18 1610    Shaune Pollack, MD 03/04/18 1236

## 2018-03-03 NOTE — ED Triage Notes (Signed)
Pt reports hx of asthma as a child. Pt reports feeling like he had an asthma attack. He was here 2 days ago but LWBS. Pt reports he does not have an inhaler. Pt breathing even and unlabored. Pt reports chest tightness today, no chest pain currently.

## 2018-03-03 NOTE — ED Notes (Signed)
Patient verbalizes understanding of discharge instructions. Opportunity for questioning and answers were provided. Armband removed by staff, pt discharged from ED ambulatory.   

## 2018-03-03 NOTE — Discharge Instructions (Addendum)
Please read attached information. If you experience any new or worsening signs or symptoms please return to the emergency room for evaluation. Please follow-up with your primary care provider or specialist as discussed. Please use medication prescribed only as directed and discontinue taking if you have any concerning signs or symptoms.   °

## 2018-04-29 ENCOUNTER — Encounter (HOSPITAL_COMMUNITY): Payer: Self-pay | Admitting: Emergency Medicine

## 2018-04-29 ENCOUNTER — Emergency Department (HOSPITAL_COMMUNITY)
Admission: EM | Admit: 2018-04-29 | Discharge: 2018-04-29 | Disposition: A | Discharge status: Home / Self Care | Payer: Self-pay | Attending: Emergency Medicine | Admitting: Emergency Medicine

## 2018-04-29 ENCOUNTER — Emergency Department (HOSPITAL_COMMUNITY): Payer: Self-pay

## 2018-04-29 DIAGNOSIS — F419 Anxiety disorder, unspecified: Secondary | ICD-10-CM | POA: Insufficient documentation

## 2018-04-29 DIAGNOSIS — J45909 Unspecified asthma, uncomplicated: Secondary | ICD-10-CM | POA: Insufficient documentation

## 2018-04-29 DIAGNOSIS — Z87891 Personal history of nicotine dependence: Secondary | ICD-10-CM | POA: Insufficient documentation

## 2018-04-29 DIAGNOSIS — Z79899 Other long term (current) drug therapy: Secondary | ICD-10-CM | POA: Insufficient documentation

## 2018-04-29 LAB — BASIC METABOLIC PANEL
ANION GAP: 8 (ref 5–15)
BUN: 7 mg/dL (ref 6–20)
CHLORIDE: 106 mmol/L (ref 98–111)
CO2: 25 mmol/L (ref 22–32)
Calcium: 8.9 mg/dL (ref 8.9–10.3)
Creatinine, Ser: 1.08 mg/dL (ref 0.61–1.24)
GFR calc Af Amer: >60 mL/min (ref >60)
GFR calc non Af Amer: >60 mL/min (ref >60)
GLUCOSE: 104 mg/dL — AB (ref 70–99)
POTASSIUM: 4.1 mmol/L (ref 3.5–5.1)
Sodium: 139 mmol/L (ref 135–145)

## 2018-04-29 LAB — CBC
HEMATOCRIT: 46.5 % (ref 39.0–52.0)
HEMOGLOBIN: 14.9 g/dL (ref 13.0–17.0)
MCH: 26.9 pg (ref 26.0–34.0)
MCHC: 32 g/dL (ref 30.0–36.0)
MCV: 83.9 fL (ref 80.0–100.0)
Platelets: 306 10*3/uL (ref 150–400)
RBC: 5.54 MIL/uL (ref 4.22–5.81)
RDW: 12.5 % (ref 11.5–15.5)
WBC: 5.9 10*3/uL (ref 4.0–10.5)
nRBC: 0 % (ref 0.0–0.2)

## 2018-04-29 LAB — I-STAT TROPONIN, ED: TROPONIN I, POC: 0 ng/mL (ref 0.00–0.08)

## 2018-04-29 MED ORDER — HYDROXYZINE HCL 25 MG PO TABS: 25.0000 mg | ORAL_TABLET | Freq: Four times a day (QID) | ORAL | 0 refills | Status: DC

## 2018-04-29 NOTE — Discharge Instructions (Addendum)
Please read attached information. If you experience any new or worsening signs or symptoms please return to the emergency room for evaluation. Please follow-up with your primary care provider or specialist as discussed. Please use medication prescribed only as directed and discontinue taking if you have any concerning signs or symptoms.   °

## 2018-04-29 NOTE — ED Provider Notes (Signed)
[facility] EMERGENCY DEPARTMENT Provider Note   CSN: [id number] Arrival date & time: 04/29/18  1215     History   Chief Complaint Chief Complaint  Patient presents with  . Shortness of Breath  . Chest Pain    HPI Travis Hatfield. is a 37 y.o. male.  HPI   37 year old male presents today with complaints of anxiety.  Patient notes that over the last month he has had several episodes of what he believes to be re panic attacks.  Patient notes he has been slightly more stressed recently.  He notes he was driving in the car started to have a rapid respiration, chest tightness and wheezing.  Patient notes a history of asthma but notes this feels slightly different.  He denies any significant chest pain.  He notes at the time my evaluation symptoms have completely resolved.  Patient reports he quit smoking approximately 1 year ago, has no history of DVT or PE.  Denies any infectious etiology including fever or cough.  Past Medical History:  Diagnosis Date  . Asthma     There are no active problems to display for this patient.   Past Surgical History:  Procedure Laterality Date  . HERNIA REPAIR     at 11 years  . TONSILLECTOMY          Home Medications    Prior to Admission medications   Medication Sig Start Date End Date Taking? Authorizing Provider  albuterol (PROVENTIL HFA;VENTOLIN HFA) 108 (90 Base) MCG/ACT inhaler Inhale 1-2 puffs into the lungs every 6 (six) hours as needed for wheezing or shortness of breath. 03/03/18   Cherell Colvin, Tinnie Gens, PA-C  cyclobenzaprine (FLEXERIL) 10 MG tablet Take 1 tablet (10 mg total) by mouth 2 (two) times daily as needed for muscle spasms. 01/22/18   Felicie Morn, NP  diphenhydramine-acetaminophen (TYLENOL PM) 25-500 MG TABS tablet Take 2 tablets by mouth at bedtime as needed (pain/sleep).    [provider]  hydrOXYzine (ATARAX/VISTARIL) 25 MG tablet Take 1 tablet (25 mg total) by mouth every 6 (six) hours. 04/29/18    Sabriya Yono, Tinnie Gens, PA-C  ibuprofen (ADVIL,MOTRIN) 200 MG tablet Take 800 mg by mouth every 6 (six) hours as needed (for pain).    [provider]  naproxen (NAPROSYN) 375 MG tablet Take 1 tablet (375 mg total) by mouth 2 (two) times daily. Patient not taking: Reported on 02/25/2018 01/22/18   Felicie Morn, NP  ondansetron (ZOFRAN) 4 MG tablet Take 1 tablet (4 mg total) by mouth every 8 (eight) hours as needed for nausea or vomiting. Patient not taking: Reported on 03/03/2018 02/25/18   Loren Racer, MD    Family History History reviewed. No pertinent family history.  Social History Social History   Tobacco Use  . Smoking status: Former Smoker    Types: Cigarettes  . Smokeless tobacco: Never Used  Substance Use Topics  . Alcohol use: No  . Drug use: No     Allergies   Patient has no known allergies.   Review of Systems Review of Systems  All other systems reviewed and are negative.    Physical Exam Updated Vital Signs Pulse 90   Temp 98.3 F (36.8 C) (Oral)   Resp 16   Ht 5\' 6"  (1.676 m)   Wt 119.3 kg   SpO2 95%   BMI 42.45 kg/m   Physical Exam  Constitutional: He is oriented to person, place, and time. He appears well-developed and well-nourished.  HENT:  Head: Normocephalic and atraumatic.  Eyes: Pupils are equal, round, and reactive to light. Conjunctivae are normal. Right eye exhibits no discharge. Left eye exhibits no discharge. No scleral icterus.  Neck: Normal range of motion. No JVD present. No tracheal deviation present.  Cardiovascular: Normal rate, regular rhythm, normal heart sounds and intact distal pulses.  Pulmonary/Chest: Effort normal. No stridor. No respiratory distress. He has no wheezes. He has no rales. He exhibits no tenderness.  Neurological: He is alert and oriented to person, place, and time. Coordination normal.  Psychiatric: He has a normal mood and affect. His behavior is normal. Judgment and thought content normal.  Nursing  note and vitals reviewed.    ED Treatments / Results  Labs (all labs ordered are listed, but only abnormal results are displayed) Labs Reviewed  BASIC METABOLIC PANEL - Abnormal; Notable for the following components:      Result Value   Glucose, Bld 104 (*)    All other components within normal limits  CBC  I-STAT TROPONIN, ED    EKG EKG Interpretation  Date/Time:  Wednesday April 29 2018 12:22:28 EST Ventricular Rate:  96 PR Interval:  134 QRS Duration: 76 QT Interval:  324 QTC Calculation: 409 R Axis:   69 Text Interpretation:  Normal sinus rhythm Possible Left atrial enlargement Borderline ECG non specific t wave in inferior and lateral leads not apparent on this ecg Otherwise no significant change Confirmed by Melene PlanFloyd, Dan [phone]([id number]) on 04/29/2018 1:01:12 PM   Radiology Dg Chest 2 View  Result Date: 04/29/2018 CLINICAL DATA:  Chest pain and shortness of breath EXAM: CHEST - 2 VIEW COMPARISON:  March 03, 2018 FINDINGS: The lungs are clear. The heart size and pulmonary vascularity are normal. No adenopathy. No pneumothorax. Evidence of old trauma involving the lateral right clavicle, stable. IMPRESSION: No edema or consolidation. Electronically Signed   By: Bretta BangWilliam  Woodruff III M.D.   On: 04/29/2018 13:08    Procedures Procedures (including critical care time)  Medications Ordered in ED Medications - No data to display   Initial Impression / Assessment and Plan / ED Course  I have reviewed the triage vital signs and the nursing notes.  Pertinent labs & imaging results that were available during my care of the patient were reviewed by me and considered in my medical decision making (see chart for details).     37 year old male presents today with likely anxiety attack.  Patient does have asthma lower suspicion for acute asthma attack.  He has no signs of cardiac dysfunction, no signs of ACS or PE.  Patient discharged home with Vistaril as needed for acute episodes,  encouraged not to drive or operate machinery while taking.  He is given strict return precautions, encouraged follow-up as an outpatient with primary care rider.  He verbalized understanding and agreement to this plan and had no further questions or concerns.  Final Clinical Impressions(s) / ED Diagnoses   Final diagnoses:  Anxiety    ED Discharge Orders         Ordered    hydrOXYzine (ATARAX/VISTARIL) 25 MG tablet  Every 6 hours     04/29/18 1354           Eyvonne MechanicHedges, Joas Motton, PA-C 04/29/18 1357    Melene PlanFloyd, Dan, DO 04/29/18 1836

## 2018-04-29 NOTE — ED Triage Notes (Signed)
Pt states he started feeling SOB around 11:30. Pt used his inhaler and did not help. Pt wondering if this is his anxiety. Feels tightness in his chest as well.

## 2018-07-27 ENCOUNTER — Emergency Department (HOSPITAL_COMMUNITY)
Admission: EM | Admit: 2018-07-27 | Discharge: 2018-07-27 | Disposition: A | Discharge status: Home / Self Care | Payer: BLUE CROSS/BLUE SHIELD | Attending: Emergency Medicine | Admitting: Emergency Medicine

## 2018-07-27 ENCOUNTER — Emergency Department (HOSPITAL_COMMUNITY): Payer: BLUE CROSS/BLUE SHIELD

## 2018-07-27 ENCOUNTER — Other Ambulatory Visit: Payer: Self-pay

## 2018-07-27 ENCOUNTER — Encounter (HOSPITAL_COMMUNITY): Payer: Self-pay | Admitting: Emergency Medicine

## 2018-07-27 DIAGNOSIS — Z79899 Other long term (current) drug therapy: Secondary | ICD-10-CM | POA: Diagnosis not present

## 2018-07-27 DIAGNOSIS — R519 Headache, unspecified: Secondary | ICD-10-CM

## 2018-07-27 DIAGNOSIS — R51 Headache: Secondary | ICD-10-CM | POA: Insufficient documentation

## 2018-07-27 DIAGNOSIS — Z87891 Personal history of nicotine dependence: Secondary | ICD-10-CM | POA: Insufficient documentation

## 2018-07-27 DIAGNOSIS — R202 Paresthesia of skin: Secondary | ICD-10-CM | POA: Insufficient documentation

## 2018-07-27 DIAGNOSIS — J45909 Unspecified asthma, uncomplicated: Secondary | ICD-10-CM | POA: Insufficient documentation

## 2018-07-27 LAB — CBC
HCT: 46.5 % (ref 39.0–52.0)
Hemoglobin: 14.7 g/dL (ref 13.0–17.0)
MCH: 26.2 pg (ref 26.0–34.0)
MCHC: 31.6 g/dL (ref 30.0–36.0)
MCV: 82.7 fL (ref 80.0–100.0)
Platelets: 223 10*3/uL (ref 150–400)
RBC: 5.62 MIL/uL (ref 4.22–5.81)
RDW: 13 % (ref 11.5–15.5)
WBC: 5.3 10*3/uL (ref 4.0–10.5)
nRBC: 0 % (ref 0.0–0.2)

## 2018-07-27 LAB — DIFFERENTIAL
Abs Immature Granulocytes: 0.01 10*3/uL (ref 0.00–0.07)
Basophils Absolute: 0 10*3/uL (ref 0.0–0.1)
Basophils Relative: 1 %
Eosinophils Absolute: 0.2 10*3/uL (ref 0.0–0.5)
Eosinophils Relative: 3 %
Immature Granulocytes: 0 %
Lymphocytes Relative: 55 %
Lymphs Abs: 2.9 10*3/uL (ref 0.7–4.0)
Monocytes Absolute: 0.5 10*3/uL (ref 0.1–1.0)
Monocytes Relative: 9 %
NEUTROS ABS: 1.7 10*3/uL (ref 1.7–7.7)
Neutrophils Relative %: 32 %

## 2018-07-27 LAB — COMPREHENSIVE METABOLIC PANEL
ALT: 22 U/L (ref 0–44)
AST: 23 U/L (ref 15–41)
Albumin: 3.9 g/dL (ref 3.5–5.0)
Alkaline Phosphatase: 52 U/L (ref 38–126)
Anion gap: 8 (ref 5–15)
BUN: 8 mg/dL (ref 6–20)
CO2: 23 mmol/L (ref 22–32)
CREATININE: 1.08 mg/dL (ref 0.61–1.24)
Calcium: 8.7 mg/dL — ABNORMAL LOW (ref 8.9–10.3)
Chloride: 107 mmol/L (ref 98–111)
GFR calc Af Amer: >60 mL/min (ref >60)
GFR calc non Af Amer: >60 mL/min (ref >60)
Glucose, Bld: 184 mg/dL — ABNORMAL HIGH (ref 70–99)
Potassium: 3.3 mmol/L — ABNORMAL LOW (ref 3.5–5.1)
Sodium: 138 mmol/L (ref 135–145)
Total Bilirubin: 0.5 mg/dL (ref 0.3–1.2)
Total Protein: 6.7 g/dL (ref 6.5–8.1)

## 2018-07-27 LAB — APTT: aPTT: 33 seconds (ref 24–36)

## 2018-07-27 LAB — PROTIME-INR
INR: 0.98
Prothrombin Time: 12.9 seconds (ref 11.4–15.2)

## 2018-07-27 MED ORDER — METOCLOPRAMIDE HCL 5 MG/ML IJ SOLN
10.0000 mg | Freq: Once | INTRAMUSCULAR | Status: AC
  Administered 2018-07-27: 10 mg via INTRAVENOUS
  Filled 2018-07-27: qty 2

## 2018-07-27 MED ORDER — SODIUM CHLORIDE 0.9 % IV BOLUS
1000.0000 mL | Freq: Once | INTRAVENOUS | Status: AC
  Administered 2018-07-27: 1000 mL via INTRAVENOUS

## 2018-07-27 MED ORDER — DEXAMETHASONE SODIUM PHOSPHATE 10 MG/ML IJ SOLN
10.0000 mg | Freq: Once | INTRAMUSCULAR | Status: AC
  Administered 2018-07-27: 10 mg via INTRAVENOUS
  Filled 2018-07-27: qty 1

## 2018-07-27 MED ORDER — KETOROLAC TROMETHAMINE 30 MG/ML IJ SOLN
30.0000 mg | Freq: Once | INTRAMUSCULAR | Status: AC
  Administered 2018-07-27: 30 mg via INTRAVENOUS
  Filled 2018-07-27: qty 1

## 2018-07-27 NOTE — ED Triage Notes (Signed)
Pt reports constant pressure to R top of head x 3 days.  Took Excedrin Migraine without relief.  Went to bed at 10pm tonight and woke up 30 min ago with numbness to R hand.  No arm drift.  Pt taken to bridge and no code stroke activation per Dr. Blinda Leatherwood.

## 2018-07-27 NOTE — ED Provider Notes (Signed)
[facility] EMERGENCY DEPARTMENT Provider Note   CSN: [id number] Arrival date & time: 07/27/18  0019    History   Chief Complaint Chief Complaint  Patient presents with  . Headache  . Numbness    HPI Travis Hatfield. is a 38 y.o. male.     HPI   Travis Hatfield. is a 38 y.o. male, with a history of childhood asthma, presenting to the ED with headache for last three days. Pain is intermittent, dull, top of head, 7/10, nonradiating.  Has had similar headaches in the past, but they usually resolve with OTC medications and do not recur. He has been taking Excedrin, which resolves the headache, but then it recurs. He has been under increased stress and has had decreased sleep lately.  He states he had some tingling in his right hand today, but this has since resolved.   Denies fever/chills, N/V/D, neck pain/stiffness, upper respiratory symptoms, numbness, weakness, syncope, vision abnormalities, head injury, or any other complaints.  Past Medical History:  Diagnosis Date  . Asthma     There are no active problems to display for this patient.   Past Surgical History:  Procedure Laterality Date  . HERNIA REPAIR     at 11 years  . TONSILLECTOMY          Home Medications    Prior to Admission medications   Medication Sig Start Date End Date Taking? Authorizing Provider  aspirin-acetaminophen-caffeine (EXCEDRIN MIGRAINE) [phone] MG tablet Take 1 tablet by mouth every 6 (six) hours as needed for headache.   Yes [provider]  DM-Doxylamine-Acetaminophen (NYQUIL HBP COLD & FLU) 15-6.25-325 MG/15ML LIQD Take 30 mLs by mouth as needed (for cough).   Yes [provider]  albuterol (PROVENTIL HFA;VENTOLIN HFA) 108 (90 Base) MCG/ACT inhaler Inhale 1-2 puffs into the lungs every 6 (six) hours as needed for wheezing or shortness of breath. Patient not taking: Reported on 07/27/2018 03/03/18   Hedges, Tinnie Gens, PA-C  cyclobenzaprine (FLEXERIL) 10  MG tablet Take 1 tablet (10 mg total) by mouth 2 (two) times daily as needed for muscle spasms. Patient not taking: Reported on 07/27/2018 01/22/18   Felicie Morn, NP  hydrOXYzine (ATARAX/VISTARIL) 25 MG tablet Take 1 tablet (25 mg total) by mouth every 6 (six) hours. Patient not taking: Reported on 07/27/2018 04/29/18   Hedges, Tinnie Gens, PA-C  naproxen (NAPROSYN) 375 MG tablet Take 1 tablet (375 mg total) by mouth 2 (two) times daily. Patient not taking: Reported on 02/25/2018 01/22/18   Felicie Morn, NP  ondansetron (ZOFRAN) 4 MG tablet Take 1 tablet (4 mg total) by mouth every 8 (eight) hours as needed for nausea or vomiting. Patient not taking: Reported on 03/03/2018 02/25/18   Loren Racer, MD    Family History No family history on file.  Social History Social History   Tobacco Use  . Smoking status: Former Smoker    Types: Cigarettes  . Smokeless tobacco: Never Used  Substance Use Topics  . Alcohol use: No  . Drug use: No     Allergies   Patient has no known allergies.   Review of Systems Review of Systems  Constitutional: Negative for chills, diaphoresis and fever.  HENT: Negative for ear pain.   Eyes: Negative for visual disturbance.  Respiratory: Negative for shortness of breath.   Cardiovascular: Negative for chest pain.  Gastrointestinal: Negative for abdominal pain, diarrhea, nausea and vomiting.  Musculoskeletal: Negative for back pain, neck pain and neck stiffness.  Skin: Negative for rash.  Neurological: Positive for headaches. Negative for dizziness, syncope, weakness, light-headedness and numbness.       Tingling in the right hand  Psychiatric/Behavioral: Negative for confusion.  All other systems reviewed and are negative.    Physical Exam Updated Vital Signs BP 129/84 (BP Location: Right Arm)   Pulse 73   Temp 98.4 F (36.9 C) (Oral)   Resp 12   SpO2 99%   Physical Exam Vitals signs and nursing note reviewed.  Constitutional:      General: He  is not in acute distress.    Appearance: He is well-developed. He is not diaphoretic.  HENT:     Head: Normocephalic and atraumatic.     Mouth/Throat:     Mouth: Mucous membranes are moist.     Pharynx: Oropharynx is clear.  Eyes:     Extraocular Movements: Extraocular movements intact.     Conjunctiva/sclera: Conjunctivae normal.     Pupils: Pupils are equal, round, and reactive to light.  Neck:     Musculoskeletal: Normal range of motion and neck supple.  Cardiovascular:     Rate and Rhythm: Normal rate and regular rhythm.     Pulses: Normal pulses.     Heart sounds: Normal heart sounds.  Pulmonary:     Effort: Pulmonary effort is normal. No respiratory distress.     Breath sounds: Normal breath sounds.  Abdominal:     Palpations: Abdomen is soft.     Tenderness: There is no abdominal tenderness. There is no guarding.  Musculoskeletal:     Right lower leg: No edema.     Left lower leg: No edema.  Lymphadenopathy:     Cervical: No cervical adenopathy.  Skin:    General: Skin is warm and dry.  Neurological:     General: No focal deficit present.     Mental Status: He is alert and oriented to person, place, and time.     Comments: Sensation grossly intact to light touch in the extremities. No noted speech deficits. No aphasia. Patient handles oral secretions without difficulty. No noted swallowing defects.  Equal grip strength bilaterally. No arm drift. Strength 5/5 in the upper extremities. Strength 5/5 with flexion and extension of the hips, knees, and ankles bilaterally.  No gait disturbance.  Coordination intact including heel to shin and finger to nose.  Cranial nerves III-XII grossly intact.  No noted visual field deficit. No facial droop.   Psychiatric:        Mood and Affect: Mood and affect normal.        Speech: Speech normal.        Behavior: Behavior normal.      ED Treatments / Results  Labs (all labs ordered are listed, but only abnormal results are  displayed) Labs Reviewed  COMPREHENSIVE METABOLIC PANEL - Abnormal; Notable for the following components:      Result Value   Potassium 3.3 (*)    Glucose, Bld 184 (*)    Calcium 8.7 (*)    All other components within normal limits  PROTIME-INR  APTT  CBC  DIFFERENTIAL    EKG EKG Interpretation  Date/Time:  Monday July 27 2018 02:34:30 EST Ventricular Rate:  77 PR Interval:    QRS Duration: 83 QT Interval:  350 QTC Calculation: 396 R Axis:   76 Text Interpretation:  Sinus rhythm Borderline T wave abnormalities No significant change since last tracing Confirmed by Ward, Baxter Hire [phone]) on 07/27/2018 3:04:12 AM  Radiology Ct Head Wo Contrast  Result Date: 07/27/2018 CLINICAL DATA:  Severe headache for 3 days. EXAM: CT HEAD WITHOUT CONTRAST TECHNIQUE: Contiguous axial images were obtained from the base of the skull through the vertex without intravenous contrast. COMPARISON:  09/02/2017 FINDINGS: Brain: No evidence of acute infarction, hemorrhage, hydrocephalus, extra-axial collection or mass lesion/mass effect. Vascular: No hyperdense vessel or unexpected calcification. Skull: Normal. Negative for fracture or focal lesion. Sinuses/Orbits: Paranasal sinuses are clear. Opacification of a few left mastoid air cells. Right mastoids are clear. Other: None. IMPRESSION: No acute intracranial abnormalities. Partial left mastoid effusions. Electronically Signed   By: Burman Nieves M.D.   On: 07/27/2018 01:46    Procedures Procedures (including critical care time)  Medications Ordered in ED Medications  sodium chloride 0.9 % bolus 1,000 mL (1,000 mLs Intravenous New Bag/Given 07/27/18 0351)  metoCLOPramide (REGLAN) injection 10 mg (10 mg Intravenous Given 07/27/18 0358)  ketorolac (TORADOL) 30 MG/ML injection 30 mg (30 mg Intravenous Given 07/27/18 0355)  dexamethasone (DECADRON) injection 10 mg (10 mg Intravenous Given 07/27/18 0353)     Initial Impression / Assessment and Plan /  ED Course  I have reviewed the triage vital signs and the nursing notes.  Pertinent labs & imaging results that were available during my care of the patient were reviewed by me and considered in my medical decision making (see chart for details).  Clinical Course as of Jul 27 514  Mon Jul 27, 2018  8875 Patient is sleeping and appears to be resting comfortably.  He states his pain has resolved.  No onset of additional symptoms.   [SJ]    Clinical Course User Index [SJ] Blakeley Scheier C, PA-C       Patient presents with headache recurrent over the last 3 days. Patient is nontoxic appearing, afebrile, not tachycardic on my exam, not tachypneic, not hypotensive, maintains excellent SPO2 on room air, and is in no apparent distress.  No focal neuro deficits on my exam.  Mild hypokalemia, but otherwise lab results overall reassuring.  No acute abnormality on head CT. I suspect contributing factor to the patient's recurrent headaches is his large amount of recent stress as well as his insomnia due to his stress.  Patient had complete resolution in his symptoms here in the ED without recurrence. The patient was given instructions for home care as well as return precautions. Patient voices understanding of these instructions, accepts the plan, and is comfortable with discharge.   Vitals:   07/27/18 0044 07/27/18 0235 07/27/18 0330 07/27/18 0400  BP: (!) 155/91 129/84 124/61 121/71  Pulse: (!) 101 73    Resp: 18 12 12 13   Temp: 98.6 F (37 C) 98.4 F (36.9 C)    TempSrc: Oral Oral    SpO2: 100% 99%       Final Clinical Impressions(s) / ED Diagnoses   Final diagnoses:  Recurrent headache    ED Discharge Orders    None       Concepcion Living 07/27/18 0518    Ward, Layla Maw, DO 07/27/18 [phone]

## 2018-07-27 NOTE — Discharge Instructions (Signed)
Headache °Your lab results showed no acute abnormalities.  °For future headaches please try the following regimen: °Antiinflammatory medications: Take 600 mg of ibuprofen every 6 hours or 440 mg (over the counter dose) to 500 mg (prescription dose) of naproxen every 12 hours.  Use this regimen until headache subsides for up to 3 days .  After this time, these medications may be used as needed for pain. Take these medications with food to avoid upset stomach. Choose only one of these medications, do not take them together. °Tylenol: Should you continue to have additional pain while taking the ibuprofen or naproxen, you may add in tylenol as needed. Your daily total maximum amount of tylenol from all sources should be limited to 4000mg/day for persons without liver problems, or 2000mg/day for those with liver problems. ° °Hydration: Have a goal of about a half liter of water every couple hours to stay well hydrated.  ° °Sleep: Please be sure to get plenty of sleep with a goal of 8 hours per night. Having a regular bed time and bedtime routine can help with this. ° °Screens: Reduce the amount of time you are in front of screens.  Take about a 5-10-minute break every hour or every couple hours to give your eyes rest.  Do not use screens in dark rooms.  Glasses with a blue light filter may also help reduce eye fatigue. ° °Stress: Take steps to reduce stress as much as possible.  ° °Follow up: Follow-up with your primary care provider on this issue.  May also need to follow-up with the neurologist for increased frequency of headaches.  °

## 2018-10-18 ENCOUNTER — Emergency Department (HOSPITAL_COMMUNITY)
Admission: EM | Admit: 2018-10-18 | Discharge: 2018-10-18 | Disposition: A | Discharge status: Home / Self Care | Payer: BLUE CROSS/BLUE SHIELD | Attending: Emergency Medicine | Admitting: Emergency Medicine

## 2018-10-18 ENCOUNTER — Other Ambulatory Visit: Payer: Self-pay

## 2018-10-18 ENCOUNTER — Emergency Department (HOSPITAL_COMMUNITY): Payer: BLUE CROSS/BLUE SHIELD

## 2018-10-18 DIAGNOSIS — R748 Abnormal levels of other serum enzymes: Secondary | ICD-10-CM

## 2018-10-18 DIAGNOSIS — R109 Unspecified abdominal pain: Secondary | ICD-10-CM | POA: Diagnosis present

## 2018-10-18 DIAGNOSIS — J45909 Unspecified asthma, uncomplicated: Secondary | ICD-10-CM | POA: Insufficient documentation

## 2018-10-18 DIAGNOSIS — Z79899 Other long term (current) drug therapy: Secondary | ICD-10-CM | POA: Insufficient documentation

## 2018-10-18 DIAGNOSIS — Z87891 Personal history of nicotine dependence: Secondary | ICD-10-CM | POA: Insufficient documentation

## 2018-10-18 DIAGNOSIS — Z7982 Long term (current) use of aspirin: Secondary | ICD-10-CM | POA: Diagnosis not present

## 2018-10-18 LAB — COMPREHENSIVE METABOLIC PANEL
ALT: 21 U/L (ref 0–44)
AST: 19 U/L (ref 15–41)
Albumin: 4.1 g/dL (ref 3.5–5.0)
Alkaline Phosphatase: 61 U/L (ref 38–126)
Anion gap: 9 (ref 5–15)
BUN: 7 mg/dL (ref 6–20)
CO2: 23 mmol/L (ref 22–32)
Calcium: 9.2 mg/dL (ref 8.9–10.3)
Chloride: 108 mmol/L (ref 98–111)
Creatinine, Ser: 1.04 mg/dL (ref 0.61–1.24)
GFR calc Af Amer: >60 mL/min (ref >60)
GFR calc non Af Amer: >60 mL/min (ref >60)
Glucose, Bld: 104 mg/dL — ABNORMAL HIGH (ref 70–99)
Potassium: 4 mmol/L (ref 3.5–5.1)
Sodium: 140 mmol/L (ref 135–145)
Total Bilirubin: 0.5 mg/dL (ref 0.3–1.2)
Total Protein: 6.7 g/dL (ref 6.5–8.1)

## 2018-10-18 LAB — CBC WITH DIFFERENTIAL/PLATELET
Abs Immature Granulocytes: 0.01 10*3/uL (ref 0.00–0.07)
Basophils Absolute: 0.1 10*3/uL (ref 0.0–0.1)
Basophils Relative: 1 %
Eosinophils Absolute: 0.2 10*3/uL (ref 0.0–0.5)
Eosinophils Relative: 4 %
HCT: 46.5 % (ref 39.0–52.0)
Hemoglobin: 15.2 g/dL (ref 13.0–17.0)
Immature Granulocytes: 0 %
Lymphocytes Relative: 41 %
Lymphs Abs: 1.7 10*3/uL (ref 0.7–4.0)
MCH: 27.1 pg (ref 26.0–34.0)
MCHC: 32.7 g/dL (ref 30.0–36.0)
MCV: 82.9 fL (ref 80.0–100.0)
Monocytes Absolute: 0.5 10*3/uL (ref 0.1–1.0)
Monocytes Relative: 11 %
Neutro Abs: 1.8 10*3/uL (ref 1.7–7.7)
Neutrophils Relative %: 43 %
Platelets: 232 10*3/uL (ref 150–400)
RBC: 5.61 MIL/uL (ref 4.22–5.81)
RDW: 12.6 % (ref 11.5–15.5)
WBC: 4.3 10*3/uL (ref 4.0–10.5)
nRBC: 0 % (ref 0.0–0.2)

## 2018-10-18 LAB — URINALYSIS, ROUTINE W REFLEX MICROSCOPIC
Bilirubin Urine: NEGATIVE
Glucose, UA: NEGATIVE mg/dL
Hgb urine dipstick: NEGATIVE
Ketones, ur: NEGATIVE mg/dL
Leukocytes,Ua: NEGATIVE
Nitrite: NEGATIVE
Protein, ur: NEGATIVE mg/dL
Specific Gravity, Urine: 1.02 (ref 1.005–1.030)
pH: 6 (ref 5.0–8.0)

## 2018-10-18 LAB — LIPASE, BLOOD: Lipase: 80 U/L — ABNORMAL HIGH (ref 11–51)

## 2018-10-18 MED ORDER — IBUPROFEN 600 MG PO TABS: 600.0000 mg | ORAL_TABLET | Freq: Four times a day (QID) | ORAL | 0 refills | Status: DC | PRN

## 2018-10-18 NOTE — ED Triage Notes (Addendum)
Pt c/o left sided flank pain radiating to the front  on and off x 1 month but has getting worse in the past 2 weeks ; pt denies any dysuria or n/v ; pt denies any trauma to the area , pt also c/o slight cough that began 2 weeks ago ; denies any fevers or contact with anyone that has been sick; denies any SOB ;

## 2018-10-18 NOTE — Discharge Instructions (Signed)
Try a clear liquid diet for 2-3 days to see if this helps the pain in your side Take Ibuprofen for pain every 6-8 hours as needed Please follow up with GI  Return if you are worsening

## 2018-10-18 NOTE — ED Provider Notes (Signed)
[facility] EMERGENCY DEPARTMENT Provider Note   CSN: [id number] Arrival date & time: 10/18/18  1000    History   Chief Complaint Chief Complaint  Patient presents with  . Flank Pain    HPI Travis Hatfield. is a 38 y.o. male who presents with left flank pain. PMH significant for asthma. He states he has had left flank pain that radiates from the side to the LLQ for several months. It has been progressively worsening. It is a dull ache but also sharp at times. He states that when he puts his cell phone his his pocket it makes his pain worse. He has not taken anything for pain. He denies fever, chills, chest pain, SOB, back pain. He reports a mild cough that is productive at times for the past couple weeks. He does not smoke. He denies N/V/D, dysuria, hematuria. He reports some hesitancy. No prior abdominal surgeries. He is worried about pancreatic cancer. He denies hx of gallstones or significant ETOH use.     HPI  Past Medical History:  Diagnosis Date  . Asthma     There are no active problems to display for this patient.   Past Surgical History:  Procedure Laterality Date  . HERNIA REPAIR     at 11 years  . TONSILLECTOMY          Home Medications    Prior to Admission medications   Medication Sig Start Date End Date Taking? Authorizing Provider  albuterol (PROVENTIL HFA;VENTOLIN HFA) 108 (90 Base) MCG/ACT inhaler Inhale 1-2 puffs into the lungs every 6 (six) hours as needed for wheezing or shortness of breath. Patient not taking: Reported on 07/27/2018 03/03/18   Eyvonne Mechanic, PA-C  aspirin-acetaminophen-caffeine (EXCEDRIN MIGRAINE) [phone] MG tablet Take 1 tablet by mouth every 6 (six) hours as needed for headache.    [provider]  cyclobenzaprine (FLEXERIL) 10 MG tablet Take 1 tablet (10 mg total) by mouth 2 (two) times daily as needed for muscle spasms. Patient not taking: Reported on 07/27/2018 01/22/18   Felicie Morn, NP   DM-Doxylamine-Acetaminophen (NYQUIL HBP COLD & FLU) 15-6.25-325 MG/15ML LIQD Take 30 mLs by mouth as needed (for cough).    [provider]  hydrOXYzine (ATARAX/VISTARIL) 25 MG tablet Take 1 tablet (25 mg total) by mouth every 6 (six) hours. Patient not taking: Reported on 07/27/2018 04/29/18   Hedges, Tinnie Gens, PA-C  naproxen (NAPROSYN) 375 MG tablet Take 1 tablet (375 mg total) by mouth 2 (two) times daily. Patient not taking: Reported on 02/25/2018 01/22/18   Felicie Morn, NP  ondansetron (ZOFRAN) 4 MG tablet Take 1 tablet (4 mg total) by mouth every 8 (eight) hours as needed for nausea or vomiting. Patient not taking: Reported on 03/03/2018 02/25/18   Loren Racer, MD    Family History No family history on file.  Social History Social History   Tobacco Use  . Smoking status: Former Smoker    Types: Cigarettes  . Smokeless tobacco: Never Used  Substance Use Topics  . Alcohol use: No  . Drug use: No     Allergies   Patient has no known allergies.   Review of Systems Review of Systems  Constitutional: Negative for appetite change and fever.  Respiratory: Positive for cough. Negative for shortness of breath and wheezing.   Cardiovascular: Negative for chest pain.  Gastrointestinal: Positive for abdominal pain. Negative for constipation, diarrhea, nausea and vomiting.  Genitourinary: Positive for flank pain. Negative for difficulty urinating, dysuria, hematuria,  penile pain and testicular pain.  All other systems reviewed and are negative.    Physical Exam Updated Vital Signs BP (!) 117/57   Pulse 77   Temp 98 F (36.7 C) (Oral)   Resp 20   Ht 5\' 6"  (1.676 m)   Wt 108.9 kg   SpO2 97%   BMI 38.74 kg/m   Physical Exam Vitals signs and nursing note reviewed.  Constitutional:      General: He is not in acute distress.    Appearance: He is well-developed. He is obese. He is not ill-appearing.  HENT:     Head: Normocephalic and atraumatic.     Ears:      Comments: Large keloid appearing lesion behind the left ear that extends to the neck (pt reports this has been present for ~2 years) Eyes:     General: No scleral icterus.       Right eye: No discharge.        Left eye: No discharge.     Conjunctiva/sclera: Conjunctivae normal.     Pupils: Pupils are equal, round, and reactive to light.  Neck:     Musculoskeletal: Normal range of motion.  Cardiovascular:     Rate and Rhythm: Normal rate and regular rhythm.  Pulmonary:     Effort: Pulmonary effort is normal. No respiratory distress.     Breath sounds: Normal breath sounds.  Abdominal:     General: There is no distension.     Palpations: Abdomen is soft.     Tenderness: There is abdominal tenderness (Mild tenderness over the L flank and LLQ). There is left CVA tenderness. There is no right CVA tenderness or guarding.  Skin:    General: Skin is warm and dry.  Neurological:     Mental Status: He is alert and oriented to person, place, and time.  Psychiatric:        Behavior: Behavior normal.      ED Treatments / Results  Labs (all labs ordered are listed, but only abnormal results are displayed) Labs Reviewed  COMPREHENSIVE METABOLIC PANEL - Abnormal; Notable for the following components:      Result Value   Glucose, Bld 104 (*)    All other components within normal limits  LIPASE, BLOOD - Abnormal; Notable for the following components:   Lipase 80 (*)    All other components within normal limits  CBC WITH DIFFERENTIAL/PLATELET  URINALYSIS, ROUTINE W REFLEX MICROSCOPIC    EKG None  Radiology Ct Renal Stone Study  Result Date: 10/18/2018 CLINICAL DATA:  Left flank pain.  Evaluate for kidney stones. EXAM: CT ABDOMEN AND PELVIS WITHOUT CONTRAST TECHNIQUE: Multidetector CT imaging of the abdomen and pelvis was performed following the standard protocol without IV contrast. COMPARISON:  05/18/2015 FINDINGS: Lower chest: No acute abnormality. Hepatobiliary: No focal liver  abnormality is seen. No gallstones, gallbladder wall thickening, or biliary dilatation. Pancreas: Unremarkable. No pancreatic ductal dilatation or surrounding inflammatory changes. Spleen: Normal in size without focal abnormality. Adrenals/Urinary Tract: Normal appearance of the adrenal glands. The kidneys are unremarkable. No kidney stones or hydronephrosis identified bilaterally. Stomach/Bowel: Stomach is within normal limits. Appendix appears normal. No evidence of bowel wall thickening, distention, or inflammatory changes. Vascular/Lymphatic: No significant vascular findings are present. No enlarged abdominal or pelvic lymph nodes. Reproductive: Prostate is unremarkable. Other: No abdominal wall hernia or abnormality. No abdominopelvic ascites. Musculoskeletal: No acute or significant osseous findings. IMPRESSION: 1. No acute findings.  No kidney stone or hydronephrosis identified. Electronically  Signed   By: Signa Kell M.D.   On: 10/18/2018 11:23    Procedures Procedures (including critical care time)  Medications Ordered in ED Medications - No data to display   Initial Impression / Assessment and Plan / ED Course  I have reviewed the triage vital signs and the nursing notes.  Pertinent labs & imaging results that were available during my care of the patient were reviewed by me and considered in my medical decision making (see chart for details).  38 year old male presents with gradually worsening chronic L flank pain. Unclear etiology. Possibly MSK, pancreatitis, kidney stone, pyelo. He is initially very hypertensive which has resolved on recheck. Other vitals are normal. On exam he is mildly tender over the L flank, LLQ. Will obtain labs, UA, Ct renal.  CBC is normal. CMP is normal. Lipase is slightly elevated to 80. UA is normal. CT renal is negative. Disucussed results with pt. Question mild chronic pancreatitis? He was recommended to try a clear liquid diet for several days and was  given Ibuprofen for pain. He was given a GI referral.  Final Clinical Impressions(s) / ED Diagnoses   Final diagnoses:  Left flank pain  Elevated lipase    ED Discharge Orders    None       Bethel Born, PA-C 10/18/18 1340    Tegeler, Canary Brim, MD 10/18/18 1432

## 2018-10-18 NOTE — ED Notes (Signed)
Patient verbalizes understanding of discharge instructions. Opportunity for questioning and answering were provided.  patient discharged from ED.  

## 2018-10-18 NOTE — ED Notes (Signed)
Left side pain for 3 months per pt.

## 2019-09-16 ENCOUNTER — Ambulatory Visit: Payer: Self-pay | Attending: Internal Medicine

## 2019-09-16 ENCOUNTER — Ambulatory Visit: Payer: Self-pay

## 2019-09-16 DIAGNOSIS — Z23 Encounter for immunization: Secondary | ICD-10-CM

## 2019-09-16 NOTE — Progress Notes (Signed)
   [id number]  Name:  Travis Hatfield.    MRN: [id number] DOB: Oct 26, 1980  09/16/2019  Mr. [name] was observed post Covid-19 immunization for 15 minutes without incident. He was provided with Vaccine Information Sheet and instruction to access the V-Safe system.   Mr. [name] was instructed to call 911 with any severe reactions post vaccine: Marland Kitchen Difficulty breathing  . Swelling of face and throat  . A fast heartbeat  . A bad rash all over body  . Dizziness and weakness   Immunizations Administered    Name Date Dose VIS Date Route   Pfizer COVID-19 Vaccine 09/16/2019  8:59 AM 0.3 mL 05/14/2019 Intramuscular   Manufacturer: [employer], [employer]   Lot: [id number]   NDC: [address]-9

## 2019-10-11 ENCOUNTER — Ambulatory Visit: Payer: Self-pay | Attending: Internal Medicine

## 2019-10-11 DIAGNOSIS — Z23 Encounter for immunization: Secondary | ICD-10-CM

## 2019-10-11 NOTE — Progress Notes (Signed)
   [id number]  Name:  Salik Grewell.    MRN: [id number] DOB: 1980/06/17  10/11/2019  Mr. Withem was observed post Covid-19 immunization for 15 minutes without incident. He was provided with Vaccine Information Sheet and instruction to access the V-Safe system.   Mr. Tokarz was instructed to call 911 with any severe reactions post vaccine: Marland Kitchen Difficulty breathing  . Swelling of face and throat  . A fast heartbeat  . A bad rash all over body  . Dizziness and weakness   Immunizations Administered    Name Date Dose VIS Date Route   Pfizer COVID-19 Vaccine 10/11/2019  8:28 AM 0.3 mL 07/28/2018 Intramuscular   Manufacturer: [employer], [employer]   Lot: [id number]   NDC: [address]-1

## 2020-05-09 ENCOUNTER — Emergency Department (HOSPITAL_COMMUNITY)
Admission: EM | Admit: 2020-05-09 | Discharge: 2020-05-09 | Disposition: A | Discharge status: Home / Self Care | Payer: Self-pay | Attending: Emergency Medicine | Admitting: Emergency Medicine

## 2020-05-09 ENCOUNTER — Other Ambulatory Visit: Payer: Self-pay

## 2020-05-09 DIAGNOSIS — R200 Anesthesia of skin: Secondary | ICD-10-CM | POA: Insufficient documentation

## 2020-05-09 DIAGNOSIS — M79606 Pain in leg, unspecified: Secondary | ICD-10-CM | POA: Insufficient documentation

## 2020-05-09 DIAGNOSIS — M545 Low back pain, unspecified: Secondary | ICD-10-CM | POA: Insufficient documentation

## 2020-05-09 DIAGNOSIS — Z5321 Procedure and treatment not carried out due to patient leaving prior to being seen by health care provider: Secondary | ICD-10-CM | POA: Insufficient documentation

## 2020-05-09 NOTE — ED Notes (Signed)
Travis Hatfield(Rep of Facilities Patient come from#[phone]) called for update on patient's status.  Will provide transportation for him.  Thank you

## 2020-05-09 NOTE — ED Notes (Signed)
Called pt multiple times for room,

## 2020-05-09 NOTE — ED Triage Notes (Signed)
Pt endorses pain in L lower back without injury with the feeling of icy hot and numbness on his L thigh x 2 weeks. Reports worsening of symptoms since the onset. Denies weakness in leg but does say pain has developed to same, whereas it was just numb before.

## 2020-05-31 ENCOUNTER — Emergency Department (HOSPITAL_COMMUNITY)
Admission: EM | Admit: 2020-05-31 | Discharge: 2020-05-31 | Disposition: A | Discharge status: Home / Self Care | Payer: Self-pay | Attending: Emergency Medicine | Admitting: Emergency Medicine

## 2020-05-31 DIAGNOSIS — M5416 Radiculopathy, lumbar region: Secondary | ICD-10-CM | POA: Insufficient documentation

## 2020-05-31 DIAGNOSIS — Z87891 Personal history of nicotine dependence: Secondary | ICD-10-CM | POA: Insufficient documentation

## 2020-05-31 DIAGNOSIS — J45909 Unspecified asthma, uncomplicated: Secondary | ICD-10-CM | POA: Insufficient documentation

## 2020-05-31 MED ORDER — DICLOFENAC SODIUM 1 % EX GEL: 2.0000 g | Freq: Four times a day (QID) | CUTANEOUS | 0 refills | Status: DC

## 2020-05-31 MED ORDER — KETOROLAC TROMETHAMINE 60 MG/2ML IM SOLN
60.0000 mg | Freq: Once | INTRAMUSCULAR | Status: AC
  Administered 2020-05-31: 10:00:00 60 mg via INTRAMUSCULAR
  Filled 2020-05-31: qty 2

## 2020-05-31 MED ORDER — METHOCARBAMOL 500 MG PO TABS: 500.0000 mg | ORAL_TABLET | Freq: Two times a day (BID) | ORAL | 0 refills | Status: DC

## 2020-05-31 MED ORDER — IBUPROFEN 600 MG PO TABS: 600.0000 mg | ORAL_TABLET | Freq: Four times a day (QID) | ORAL | 0 refills | Status: DC | PRN

## 2020-05-31 NOTE — Discharge Instructions (Addendum)
Your history and physical exam is consistent with a lumbar strain with radicular symptoms (shooting pains/pins-and-needles sensation).  Please read the attachments radicular pain and back injury prevention.  Please see the ibuprofen 600 mg every 6 hours, as directed.  Do not combine with other NSAIDs.  I have also prescribed you Voltaren gel which can apply topically to your low back.  You were given a prescription for Robaxin which is a muscle relaxer.  You should not drive, work, consume alcohol, or operate machinery while taking this medication as it can make you very drowsy.    I recommend that you wear a lifting belt if you are to continue with your work.  However, please have discussion with your employer to see if you can work a position that is less physically demanding until your symptoms improve.  Please get established with a primary care provider at [facility].  Your BP is elevated here in the ED and you need to have regular outpatient follow-up.    Return to the ED or seek immediate medical attention should you experience any new or worsening symptoms.

## 2020-05-31 NOTE — ED Provider Notes (Signed)
[facility] EMERGENCY DEPARTMENT Provider Note   CSN: [id number] Arrival date & time: 05/31/20  7893     History No chief complaint on file.   Travis Hatfield. is a 39 y.o. male with no significant past medical history who presents to the ED with a 1 month history of left-sided low back pain with pins-and-needles sensation in his left leg.  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  He does not wear any back support.  Patient reports that he first noticed his symptoms approximately 1 month ago and they have been constant, although worse with any bending, lifting, or twisting.  He also states that it is painful simply standing.  He denies any history of malignancy, IVDA, recent fevers or chills, precipitating trauma, true numbness or weakness, night sweats, urinary symptoms, cough, abdominal pain, or other symptoms.  Patient has not taken anything for his symptoms.  He continues to go to work and admits that he could be potentially reaggravating his symptoms.  He is ambulatory and drove here to the ED.  He does not have a primary care provider as he does not have any medical insurance.  HPI     Past Medical History:  Diagnosis Date  . Asthma     There are no problems to display for this patient.   Past Surgical History:  Procedure Laterality Date  . HERNIA REPAIR     at 11 years  . TONSILLECTOMY         No family history on file.  Social History   Tobacco Use  . Smoking status: Former Smoker    Types: Cigarettes  . Smokeless tobacco: Never Used  Substance Use Topics  . Alcohol use: No  . Drug use: No    Home Medications Prior to Admission medications   Medication Sig Start Date End Date Taking? Authorizing Provider  diclofenac Sodium (VOLTAREN) 1 % GEL Apply 2 g topically 4 (four) times daily. 05/31/20  Yes Lorelee New, PA-C  ibuprofen (ADVIL) 600 MG tablet Take 1 tablet (600 mg total) by  mouth every 6 (six) hours as needed. 05/31/20  Yes Lorelee New, PA-C  methocarbamol (ROBAXIN) 500 MG tablet Take 1 tablet (500 mg total) by mouth 2 (two) times daily. 05/31/20  Yes Lorelee New, PA-C  aspirin-acetaminophen-caffeine (EXCEDRIN MIGRAINE) [phone] MG tablet Take 1 tablet by mouth every 6 (six) hours as needed for headache.    [provider]  DM-Doxylamine-Acetaminophen (NYQUIL HBP COLD & FLU) 15-6.25-325 MG/15ML LIQD Take 30 mLs by mouth as needed (for cough).    [provider]    Allergies    Patient has no known allergies.  Review of Systems   Review of Systems  Constitutional: Negative for chills and fever.  Respiratory: Negative for cough and shortness of breath.   Gastrointestinal: Negative for abdominal pain.  Musculoskeletal: Positive for back pain. Negative for gait problem.  Skin: Negative for color change and wound.  Neurological: Negative for weakness and numbness.    Physical Exam Updated Vital Signs BP (!) 158/87 (BP Location: Right Arm)   Pulse 94   Temp 98.6 F (37 C) (Oral)   Resp 16   SpO2 96%   Physical Exam Vitals and nursing note reviewed. Exam conducted with a chaperone present.  Constitutional:      General: He is not in acute distress.    Appearance: Normal appearance. He is not ill-appearing or  toxic-appearing.  HENT:     Head: Normocephalic and atraumatic.  Eyes:     General: No scleral icterus.    Conjunctiva/sclera: Conjunctivae normal.  Cardiovascular:     Rate and Rhythm: Normal rate.     Pulses: Normal pulses.  Pulmonary:     Effort: Pulmonary effort is normal. No respiratory distress.  Musculoskeletal:        General: No signs of injury. Normal range of motion.     Right lower leg: No edema.     Left lower leg: No edema.     Comments: No significant midline spinal TTP.  TTP over left-sided paraspinous muscles in lumbar region.  No redness, swelling, or other overlying skin changes.  Negative  right-sided SLR.  Positive left-sided SLR with sharp shooting pains elicited down to the left hamstring/popliteal region..  Peripheral pulses intact and symmetric.  ROM and strength fully intact.  Sensation intact and symmetric throughout.  Skin:    General: Skin is dry.     Capillary Refill: Capillary refill takes less than 2 seconds.  Neurological:     General: No focal deficit present.     Mental Status: He is alert and oriented to person, place, and time.     GCS: GCS eye subscore is 4. GCS verbal subscore is 5. GCS motor subscore is 6.     Cranial Nerves: No cranial nerve deficit.     Sensory: No sensory deficit.     Motor: No weakness.     Coordination: Coordination normal.     Gait: Gait normal.  Psychiatric:        Mood and Affect: Mood normal.        Behavior: Behavior normal.        Thought Content: Thought content normal.     ED Results / Procedures / Treatments   Labs (all labs ordered are listed, but only abnormal results are displayed) Labs Reviewed - No data to display  EKG None  Radiology No results found.  Procedures Procedures (including critical care time)  Medications Ordered in ED Medications  ketorolac (TORADOL) injection 60 mg (has no administration in time range)    ED Course  I have reviewed the triage vital signs and the nursing notes.  Pertinent labs & imaging results that were available during my care of the patient were reviewed by me and considered in my medical decision making (see chart for details).    MDM Rules/Calculators/A&P                          Patient's history and physical exam is consistent with a left-sided lumbar strain with left-sided radiculopathy.  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  He has not taken anything for his symptoms.  He is functionally and neurovascularly intact.  Patient's discomfort is aggravated by any bending, lifting, or twisting.  No red flags concerning patient's  back pain. No s/s of central cord compression or cauda equina. Lower extremities are neurovascularly intact and patient is ambulating without difficulty.  We will treat with IM Toradol here in the ED.  We will start him home with a course of ibuprofen 600 mg every 6 hours as needed for pain.  We will also prescribe him a short course of Robaxin, discussed adverse effects and precautions.  Finally, will prescribe a topical Voltaren gel for additional relief.  He denies any history of PUD or renal impairment.  We will  encourage him to get established with a primary care provider at the [facility] and [facility].  ED return precautions discussed.  Patient voices understanding and is agreeable to the plan.  Patient with elevated BP today. Unlikely to be related to CC and patient is without symptoms concerning for end organ damage. I discussed with patient their elevated blood pressure and need for close outpatient management of their hypertension. Patient counseled on long-term effects of elevated BP including kidney damage, vascular damage, retinopathy, and risk of stroke and other dangerous outcomes. Patient understanding of close PCP follow up.  Final Clinical Impression(s) / ED Diagnoses Final diagnoses:  Lumbar back pain with radiculopathy affecting left lower extremity    Rx / DC Orders ED Discharge Orders         Ordered    ibuprofen (ADVIL) 600 MG tablet  Every 6 hours PRN        05/31/20 0952    methocarbamol (ROBAXIN) 500 MG tablet  2 times daily        05/31/20 0952    diclofenac Sodium (VOLTAREN) 1 % GEL  4 times daily        05/31/20 0952           Lorelee New, PA-C 05/31/20 8413    Pricilla Loveless, MD 05/31/20 1023

## 2020-05-31 NOTE — ED Triage Notes (Signed)
Pt here with c/o left leg pain and low back pain , pt was here before but left before being seen

## 2020-09-16 ENCOUNTER — Emergency Department (HOSPITAL_COMMUNITY)
Admission: EM | Admit: 2020-09-16 | Discharge: 2020-09-16 | Disposition: A | Discharge status: Home / Self Care | Payer: 59 | Attending: Emergency Medicine | Admitting: Emergency Medicine

## 2020-09-16 ENCOUNTER — Other Ambulatory Visit: Payer: Self-pay

## 2020-09-16 ENCOUNTER — Encounter (HOSPITAL_COMMUNITY): Payer: Self-pay | Admitting: Emergency Medicine

## 2020-09-16 DIAGNOSIS — Z87891 Personal history of nicotine dependence: Secondary | ICD-10-CM | POA: Insufficient documentation

## 2020-09-16 DIAGNOSIS — J45909 Unspecified asthma, uncomplicated: Secondary | ICD-10-CM | POA: Insufficient documentation

## 2020-09-16 DIAGNOSIS — R202 Paresthesia of skin: Secondary | ICD-10-CM | POA: Diagnosis not present

## 2020-09-16 DIAGNOSIS — R200 Anesthesia of skin: Secondary | ICD-10-CM | POA: Diagnosis present

## 2020-09-16 LAB — CBG MONITORING, ED: Glucose-Capillary: 102 mg/dL — ABNORMAL HIGH (ref 70–99)

## 2020-09-16 NOTE — ED Provider Notes (Signed)
[facility] EMERGENCY DEPARTMENT Provider Note   CSN: [id number] Arrival date & time: 09/16/20  0747     History No chief complaint on file.   Travis Hatfield. is a 40 y.o. male with no pertinent past medical history that presents emergency department today for left leg numbness for the past 4 years.  Patient states that he has been seen here multiple times for this, no treatment has ever helped.Per chart review patient was seen here for paresthesias in the left leg in September 2018.Marland Kitchen  He is in been seen multiple times for lumbar back pain with radiculopathy affecting left lower leg.  Patient states that he keeps getting diagnosed with different things, however continues to have the paresthesias in his left leg.  Did not radiate anywhere.  No back trauma, bowel or bladder incontinence, dysuria or hematuria.  Denies any leg trauma.  Denies any difficulty walking or weakness.  Denies any fevers or joint pain.  States that symptoms have been persistent since 2018.  Does work a strenuous job.  States that diabetes does run in his family, has not been able to see a PCP yet.  States that he does have an appointment with his PCP on the 28th.  Denies any chest pain shortness of breath fevers nausea vomiting abdominal pain back pain or other complaints.  HPI     Past Medical History:  Diagnosis Date  . Asthma     There are no problems to display for this patient.   Past Surgical History:  Procedure Laterality Date  . HERNIA REPAIR     at 11 years  . TONSILLECTOMY         No family history on file.  Social History   Tobacco Use  . Smoking status: Former Smoker    Types: Cigarettes  . Smokeless tobacco: Never Used  Substance Use Topics  . Alcohol use: No  . Drug use: No    Home Medications Prior to Admission medications   Medication Sig Start Date End Date Taking? Authorizing Provider  aspirin-acetaminophen-caffeine (EXCEDRIN MIGRAINE) [phone] MG  tablet Take 1 tablet by mouth every 6 (six) hours as needed for headache.    [provider]  diclofenac Sodium (VOLTAREN) 1 % GEL Apply 2 g topically 4 (four) times daily. 05/31/20   Lorelee New, PA-C  DM-Doxylamine-Acetaminophen (NYQUIL HBP COLD & FLU) 15-6.25-325 MG/15ML LIQD Take 30 mLs by mouth as needed (for cough).    [provider]  ibuprofen (ADVIL) 600 MG tablet Take 1 tablet (600 mg total) by mouth every 6 (six) hours as needed. 05/31/20   Lorelee New, PA-C  methocarbamol (ROBAXIN) 500 MG tablet Take 1 tablet (500 mg total) by mouth 2 (two) times daily. 05/31/20   Lorelee New, PA-C    Allergies    Patient has no known allergies.  Review of Systems   Review of Systems  Constitutional: Negative for diaphoresis, fatigue and fever.  Eyes: Negative for visual disturbance.  Respiratory: Negative for shortness of breath.   Cardiovascular: Negative for chest pain.  Gastrointestinal: Negative for nausea and vomiting.  Musculoskeletal: Negative for back pain and myalgias.  Skin: Negative for color change, pallor, rash and wound.  Neurological: Positive for numbness. Negative for syncope, weakness, light-headedness and headaches.  Psychiatric/Behavioral: Negative for behavioral problems and confusion.    Physical Exam Updated Vital Signs BP (!) 154/92 (BP Location: Left Arm)   Pulse 85   Temp 98.5 F (36.9 C) (  Oral)   Resp 18   SpO2 98%   Physical Exam Constitutional:      General: He is not in acute distress.    Appearance: Normal appearance. He is not ill-appearing, toxic-appearing or diaphoretic.  Cardiovascular:     Rate and Rhythm: Normal rate and regular rhythm.     Pulses: Normal pulses.  Pulmonary:     Effort: Pulmonary effort is normal.     Breath sounds: Normal breath sounds.  Musculoskeletal:        General: Normal range of motion.       Legs:     Comments: Area depicted above is where patient is complaining of numbness and  paresthesias.  No objective sensation changes when compared to right leg.  Normal range of motion to hip knee and foot.  Normal gait.  PT pulses 2+.  Compartments are soft.  No obvious trauma to that area.  Normal leg raise.  Skin:    General: Skin is warm and dry.     Capillary Refill: Capillary refill takes less than 2 seconds.  Neurological:     General: No focal deficit present.     Mental Status: He is alert and oriented to person, place, and time.  Psychiatric:        Mood and Affect: Mood normal.        Behavior: Behavior normal.        Thought Content: Thought content normal.     ED Results / Procedures / Treatments   Labs (all labs ordered are listed, but only abnormal results are displayed) Labs Reviewed  CBG MONITORING, ED - Abnormal; Notable for the following components:      Result Value   Glucose-Capillary 102 (*)    All other components within normal limits    EKG None  Radiology No results found.  Procedures Procedures   Medications Ordered in ED Medications - No data to display  ED Course  I have reviewed the triage vital signs and the nursing notes.  Pertinent labs & imaging results that were available during my care of the patient were reviewed by me and considered in my medical decision making (see chart for details).    MDM Rules/Calculators/A&P                          Patient presenting to the emergency department for numbness in his left lateral thigh as depicted.  Is distally neurovascularly intact.  Patient is not having any pain and did not have any trauma to that area.  No concerns for compartment syndrome, DVT.  No red flag symptoms for back, patient did not have any back pain.  Area does not follow a specific dermatome pattern, may be parts of L2 ? There is no edema, and patient has no objective numbness.  High suspicion for neuropathy, patient is overweight and has diabetes running in his family.  Patient states that he has not eaten since  last night, glucose 102.  Patient does have PCP follow-up in the next 2 weeks, did discuss management for paresthesias.  Shared decision making about gabapentin at this time, patient states that he will wait to see his PCP.  Strict return precautions given.   Doubt need for further emergent work up at this time. I explained the diagnosis and have given explicit precautions to return to the ER including for any other new or worsening symptoms. The patient understands and accepts the medical plan as  it's been dictated and I have answered their questions. Discharge instructions concerning home care and prescriptions have been given. The patient is STABLE and is discharged to home in good condition.    Final Clinical Impression(s) / ED Diagnoses Final diagnoses:  Paresthesias    Rx / DC Orders ED Discharge Orders    None       Farrel Gordon, PA-C 09/16/20 5427    Lorre Nick, MD 09/19/20 1118

## 2020-09-16 NOTE — ED Triage Notes (Signed)
C/o L upper leg numbness x 3 months.  Denies pain.  States he has been seen in ED for same before.

## 2020-09-16 NOTE — Discharge Instructions (Signed)
Please keep your PCP appointment on the 28th as discussed.  As we discussed you need to get an A1c to check for diabetes.  I attached some nutrition and exercise plans that I want to do radiate and the neuropathic pain is also attached. Your blood pressure is also slightly elevated today, I want you to have this checked with your primary care doctor as well.  Get help right away if you: Feel muscle weakness. Develop new weakness in an arm or leg. Have trouble walking or moving. Have problems with speech, understanding, or vision. Feel confused. Cannot control your bladder or bowel movements.

## 2020-09-24 ENCOUNTER — Emergency Department (HOSPITAL_COMMUNITY): Payer: BLUE CROSS/BLUE SHIELD

## 2020-09-24 ENCOUNTER — Encounter (HOSPITAL_COMMUNITY): Payer: Self-pay

## 2020-09-24 ENCOUNTER — Emergency Department (HOSPITAL_COMMUNITY)
Admission: EM | Admit: 2020-09-24 | Discharge: 2020-09-24 | Disposition: A | Discharge status: Home / Self Care | Payer: BLUE CROSS/BLUE SHIELD | Attending: Emergency Medicine | Admitting: Emergency Medicine

## 2020-09-24 ENCOUNTER — Other Ambulatory Visit: Payer: Self-pay

## 2020-09-24 DIAGNOSIS — R42 Dizziness and giddiness: Secondary | ICD-10-CM | POA: Insufficient documentation

## 2020-09-24 DIAGNOSIS — J45909 Unspecified asthma, uncomplicated: Secondary | ICD-10-CM | POA: Insufficient documentation

## 2020-09-24 DIAGNOSIS — Z87891 Personal history of nicotine dependence: Secondary | ICD-10-CM | POA: Diagnosis not present

## 2020-09-24 LAB — CBC
HCT: 48.6 % (ref 39.0–52.0)
Hemoglobin: 15.4 g/dL (ref 13.0–17.0)
MCH: 27.1 pg (ref 26.0–34.0)
MCHC: 31.7 g/dL (ref 30.0–36.0)
MCV: 85.6 fL (ref 80.0–100.0)
Platelets: 239 10*3/uL (ref 150–400)
RBC: 5.68 MIL/uL (ref 4.22–5.81)
RDW: 12.9 % (ref 11.5–15.5)
WBC: 5.4 10*3/uL (ref 4.0–10.5)
nRBC: 0 % (ref 0.0–0.2)

## 2020-09-24 LAB — BASIC METABOLIC PANEL
Anion gap: 12 (ref 5–15)
BUN: 10 mg/dL (ref 6–20)
CO2: 24 mmol/L (ref 22–32)
Calcium: 9.2 mg/dL (ref 8.9–10.3)
Chloride: 105 mmol/L (ref 98–111)
Creatinine, Ser: 1.07 mg/dL (ref 0.61–1.24)
GFR, Estimated: >60 mL/min (ref >60)
Glucose, Bld: 152 mg/dL — ABNORMAL HIGH (ref 70–99)
Potassium: 3.7 mmol/L (ref 3.5–5.1)
Sodium: 141 mmol/L (ref 135–145)

## 2020-09-24 LAB — CBG MONITORING, ED: Glucose-Capillary: 145 mg/dL — ABNORMAL HIGH (ref 70–99)

## 2020-09-24 MED ORDER — MECLIZINE HCL 50 MG PO TABS: 50.0000 mg | ORAL_TABLET | Freq: Three times a day (TID) | ORAL | 0 refills | Status: DC | PRN

## 2020-09-24 MED ORDER — MECLIZINE HCL 25 MG PO TABS
50.0000 mg | ORAL_TABLET | Freq: Once | ORAL | Status: AC
  Administered 2020-09-24: 50 mg via ORAL
  Filled 2020-09-24: qty 2

## 2020-09-24 NOTE — Discharge Instructions (Addendum)
Take the medication as prescribed to help with the dizziness.  Try doing the Epley maneuver exercises.  The instructions are included in your discharge instruction.  Follow-up with your primary care doctor to be rechecked.  Return to the ED if you start having worsening symptoms

## 2020-09-24 NOTE — ED Provider Notes (Signed)
[facility] EMERGENCY DEPARTMENT Provider Note   CSN: [id number] Arrival date & time: 09/24/20  0749     History Chief Complaint  Patient presents with  . Dizziness    Travis Zhong. is a 40 y.o. male.  HPI   Patient presented to the ED for evaluation of dizziness.  Patient states he woke up this morning and he felt dizzy and unsteady.  He feels like the room is spinning.  Gets worse when he moves his head certain positions especially when leaning forward or leaning back.  He denies any trouble with his speech but he did feel like his vision was a little off although not blurry.  He is not having any trouble with arm numbness or weakness.  When he tried to get up and walk around he felt like he was going to fall so he came to the ED for further evaluation.  Patient denies any history of heart disease or stroke  Past Medical History:  Diagnosis Date  . Asthma     There are no problems to display for this patient.   Past Surgical History:  Procedure Laterality Date  . HERNIA REPAIR     at 11 years  . TONSILLECTOMY         History reviewed. No pertinent family history.  Social History   Tobacco Use  . Smoking status: Former Smoker    Types: Cigarettes  . Smokeless tobacco: Never Used  Substance Use Topics  . Alcohol use: No  . Drug use: No    Home Medications Prior to Admission medications   Medication Sig Start Date End Date Taking? Authorizing Provider  meclizine (ANTIVERT) 50 MG tablet Take 1 tablet (50 mg total) by mouth 3 (three) times daily as needed. 09/24/20  Yes Linwood Dibbles, MD  diclofenac Sodium (VOLTAREN) 1 % GEL Apply 2 g topically 4 (four) times daily. Patient not taking: Reported on 09/24/2020 05/31/20   Lorelee New, PA-C  ibuprofen (ADVIL) 600 MG tablet Take 1 tablet (600 mg total) by mouth every 6 (six) hours as needed. Patient not taking: Reported on 09/24/2020 05/31/20   Lorelee New, PA-C  methocarbamol (ROBAXIN)  500 MG tablet Take 1 tablet (500 mg total) by mouth 2 (two) times daily. Patient not taking: Reported on 09/24/2020 05/31/20   Lorelee New, PA-C    Allergies    Patient has no known allergies.  Review of Systems   Review of Systems  All other systems reviewed and are negative.   Physical Exam Updated Vital Signs BP 120/68 (BP Location: Right Arm)   Pulse 72   Temp 98.5 F (36.9 C) (Oral)   Resp 17   Ht 1.676 m (5\' 6" )   Wt 117.9 kg   SpO2 99%   BMI 41.97 kg/m   Physical Exam Vitals and nursing note reviewed.  Constitutional:      General: He is not in acute distress.    Appearance: He is well-developed.  HENT:     Head: Normocephalic and atraumatic.     Right Ear: External ear normal.     Left Ear: External ear normal.  Eyes:     General: No scleral icterus.       Right eye: No discharge.        Left eye: No discharge.     Conjunctiva/sclera: Conjunctivae normal.  Neck:     Trachea: No tracheal deviation.  Cardiovascular:     Rate and Rhythm: Normal  rate and regular rhythm.  Pulmonary:     Effort: Pulmonary effort is normal. No respiratory distress.     Breath sounds: Normal breath sounds. No stridor. No wheezing or rales.  Abdominal:     General: Bowel sounds are normal. There is no distension.     Palpations: Abdomen is soft.     Tenderness: There is no abdominal tenderness. There is no guarding or rebound.  Musculoskeletal:        General: No tenderness.     Cervical back: Neck supple.  Skin:    General: Skin is warm and dry.     Findings: No rash.  Neurological:     Mental Status: He is alert and oriented to person, place, and time.     Cranial Nerves: No cranial nerve deficit (no facial droop, extraocular movements intact, no slurred speech ).     Sensory: No sensory deficit.     Motor: No abnormal muscle tone or seizure activity.     Coordination: Coordination normal.     Comments: No pronator drift bilateral upper extrem, able to hold both legs  off bed for 5 seconds, sensation intact in all extremities, no visual field cuts, no left or right sided neglect, normal finger-nose exam bilaterally, horizontal nystagmus noted     ED Results / Procedures / Treatments   Labs (all labs ordered are listed, but only abnormal results are displayed) Labs Reviewed  BASIC METABOLIC PANEL - Abnormal; Notable for the following components:      Result Value   Glucose, Bld 152 (*)    All other components within normal limits  CBG MONITORING, ED - Abnormal; Notable for the following components:   Glucose-Capillary 145 (*)    All other components within normal limits  CBC    EKG EKG Interpretation  Date/Time:  Sunday September 24 2020 08:04:32 EDT Ventricular Rate:  88 PR Interval:  137 QRS Duration: 88 QT Interval:  356 QTC Calculation: 431 R Axis:   70 Text Interpretation: Sinus rhythm Borderline T wave abnormalities No significant change since last tracing Confirmed by Linwood Dibbles [phone]) on 09/24/2020 8:47:08 AM   Radiology CT Head Wo Contrast  Result Date: 09/24/2020 CLINICAL DATA:  Dizziness, neuro deficit, possible stroke EXAM: CT HEAD WITHOUT CONTRAST TECHNIQUE: Contiguous axial images were obtained from the base of the skull through the vertex without intravenous contrast. COMPARISON:  07/27/2018 and previous FINDINGS: Brain: No evidence of acute infarction, hemorrhage, hydrocephalus, extra-axial collection or mass lesion/mass effect. Vascular: No hyperdense vessel or unexpected calcification. Skull: Normal. Negative for fracture or focal lesion. Sinuses/Orbits: No acute finding. Other: None. IMPRESSION: Negative for bleed or other acute intracranial process. Electronically Signed   By: Corlis Leak M.D.   On: 09/24/2020 09:55    Procedures Procedures   Medications Ordered in ED Medications  meclizine (ANTIVERT) tablet 50 mg (50 mg Oral Given 09/24/20 1047)    ED Course  I have reviewed the triage vital signs and the nursing  notes.  Pertinent labs & imaging results that were available during my care of the patient were reviewed by me and considered in my medical decision making (see chart for details).  Clinical Course as of 09/24/20 1312  Sun Sep 24, 2020  1301 CT scan and metabolic panel normal [JK]    Clinical Course User Index [JK] Linwood Dibbles, MD   MDM Rules/Calculators/A&P  Patient presented to the ED for evaluation of dizziness.  Symptoms are very positional in nature.  Patient does not have any focal neurologic deficits on exam.  He has been able to ambulate without ataxia.  Laboratory tests and CT scan unremarkable.  I suspect patient's symptoms are related to peripheral vertigo.  At this time I do not feel that further evaluation such as MRI is necessary.  Discussed warning signs and precautions with the patient.  We will have him do Epley's maneuver and try meclizine for dizziness. Final Clinical Impression(s) / ED Diagnoses Final diagnoses:  Vertigo    Rx / DC Orders ED Discharge Orders         Ordered    meclizine (ANTIVERT) 50 MG tablet  3 times daily PRN        09/24/20 1312           Linwood Dibbles, MD 09/24/20 1313

## 2020-09-24 NOTE — ED Triage Notes (Signed)
Pt states the dizziness gets worse when leaning backwards, no change when leaning forwards. Blurry vision, no black outs.

## 2020-09-24 NOTE — ED Triage Notes (Signed)
Per PT: Woke up this morning, felt dizzy, walked around and felt like he was going to fall, sat down and had wife bring in. States no new meds, no alcohol/drugs, nothing new to the diet. Denies any injuries yesterday.

## 2020-09-29 DIAGNOSIS — E785 Hyperlipidemia, unspecified: Secondary | ICD-10-CM

## 2020-09-29 DIAGNOSIS — R7303 Prediabetes: Secondary | ICD-10-CM | POA: Insufficient documentation

## 2020-09-29 DIAGNOSIS — E119 Type 2 diabetes mellitus without complications: Secondary | ICD-10-CM | POA: Insufficient documentation

## 2020-09-29 HISTORY — DX: Hyperlipidemia, unspecified: E78.5

## 2020-09-29 HISTORY — DX: Type 2 diabetes mellitus without complications: E11.9

## 2020-12-07 ENCOUNTER — Ambulatory Visit: Payer: Self-pay | Admitting: Nurse Practitioner

## 2020-12-21 ENCOUNTER — Institutional Professional Consult (permissible substitution): Payer: Self-pay | Admitting: Plastic Surgery

## 2021-02-07 ENCOUNTER — Ambulatory Visit: Payer: Self-pay | Admitting: Nurse Practitioner

## 2021-03-16 DIAGNOSIS — Z719 Counseling, unspecified: Secondary | ICD-10-CM | POA: Insufficient documentation

## 2021-05-18 DIAGNOSIS — I10 Essential (primary) hypertension: Secondary | ICD-10-CM | POA: Insufficient documentation

## 2021-05-18 DIAGNOSIS — G4733 Obstructive sleep apnea (adult) (pediatric): Secondary | ICD-10-CM | POA: Insufficient documentation

## 2021-05-18 HISTORY — DX: Obstructive sleep apnea (adult) (pediatric): G47.33

## 2021-09-29 ENCOUNTER — Emergency Department (HOSPITAL_COMMUNITY): Payer: BLUE CROSS/BLUE SHIELD

## 2021-09-29 ENCOUNTER — Other Ambulatory Visit: Payer: Self-pay

## 2021-09-29 ENCOUNTER — Emergency Department (HOSPITAL_COMMUNITY)
Admission: EM | Admit: 2021-09-29 | Discharge: 2021-09-30 | Disposition: A | Discharge status: Home / Self Care | Payer: BLUE CROSS/BLUE SHIELD | Attending: Emergency Medicine | Admitting: Emergency Medicine

## 2021-09-29 ENCOUNTER — Encounter (HOSPITAL_COMMUNITY): Payer: Self-pay | Admitting: Emergency Medicine

## 2021-09-29 DIAGNOSIS — R519 Headache, unspecified: Secondary | ICD-10-CM | POA: Insufficient documentation

## 2021-09-29 DIAGNOSIS — R000 Tachycardia, unspecified: Secondary | ICD-10-CM | POA: Insufficient documentation

## 2021-09-29 DIAGNOSIS — D72829 Elevated white blood cell count, unspecified: Secondary | ICD-10-CM | POA: Diagnosis not present

## 2021-09-29 DIAGNOSIS — R0981 Nasal congestion: Secondary | ICD-10-CM | POA: Insufficient documentation

## 2021-09-29 DIAGNOSIS — Z7984 Long term (current) use of oral hypoglycemic drugs: Secondary | ICD-10-CM | POA: Insufficient documentation

## 2021-09-29 DIAGNOSIS — R0789 Other chest pain: Secondary | ICD-10-CM | POA: Diagnosis not present

## 2021-09-29 DIAGNOSIS — R509 Fever, unspecified: Secondary | ICD-10-CM | POA: Diagnosis not present

## 2021-09-29 DIAGNOSIS — E119 Type 2 diabetes mellitus without complications: Secondary | ICD-10-CM | POA: Diagnosis not present

## 2021-09-29 DIAGNOSIS — Z79899 Other long term (current) drug therapy: Secondary | ICD-10-CM | POA: Diagnosis not present

## 2021-09-29 DIAGNOSIS — I10 Essential (primary) hypertension: Secondary | ICD-10-CM | POA: Insufficient documentation

## 2021-09-29 LAB — CBC
HCT: 47.7 % (ref 39.0–52.0)
Hemoglobin: 15.5 g/dL (ref 13.0–17.0)
MCH: 27.3 pg (ref 26.0–34.0)
MCHC: 32.5 g/dL (ref 30.0–36.0)
MCV: 84 fL (ref 80.0–100.0)
Platelets: 229 10*3/uL (ref 150–400)
RBC: 5.68 MIL/uL (ref 4.22–5.81)
RDW: 13.2 % (ref 11.5–15.5)
WBC: 11.1 10*3/uL — ABNORMAL HIGH (ref 4.0–10.5)
nRBC: 0 % (ref 0.0–0.2)

## 2021-09-29 LAB — BASIC METABOLIC PANEL
Anion gap: 10 (ref 5–15)
BUN: 7 mg/dL (ref 6–20)
CO2: 26 mmol/L (ref 22–32)
Calcium: 9.3 mg/dL (ref 8.9–10.3)
Chloride: 102 mmol/L (ref 98–111)
Creatinine, Ser: 1.19 mg/dL (ref 0.61–1.24)
GFR, Estimated: >60 mL/min (ref >60)
Glucose, Bld: 117 mg/dL — ABNORMAL HIGH (ref 70–99)
Potassium: 3.9 mmol/L (ref 3.5–5.1)
Sodium: 138 mmol/L (ref 135–145)

## 2021-09-29 LAB — TROPONIN I (HIGH SENSITIVITY)
Troponin I (High Sensitivity): 8 ng/L (ref <18)
Troponin I (High Sensitivity): 5 ng/L (ref <18)

## 2021-09-29 NOTE — ED Triage Notes (Signed)
Pt reported to ED with c/o severe headache and elevated HR. States he was resting and his HR was 120. States he recently recovered from cold. Pt also states he has been having chest pain since earlier in th evening. Denies shortness of breath and nausea vomiting.  ?

## 2021-09-30 MED ORDER — AMOXICILLIN 500 MG PO CAPS
500.0000 mg | ORAL_CAPSULE | Freq: Three times a day (TID) | ORAL | 0 refills | Status: DC
Start: 2021-09-30 — End: 2022-08-30

## 2021-09-30 NOTE — ED Provider Notes (Signed)
? ?[facility] EMERGENCY DEPARTMENT  ?Provider Note ? ?CSN: [id number] ?Arrival date & time: 09/29/21 1902 ? ?History ?Chief Complaint  ?Patient presents with  ? Headache  ? Chest Pain  ? ? ?Travis Hatfield. is a 41 y.o. male with history of HTN DM here for evaluation of headache and heart racing. He reports several days of nasal congestion, sinus pressure and fever. He had gradual onset of frontal headache earlier in the afternoon prior to arrival, associated with heart racing and then some mild chest discomfort prompting him to come to the ED. He had taken an OTC cold remedy prior to symptom onset. At the time of my evaluation his headache has resolved, HR improved and no longer having any chest discomfort.  ? ? ?Home Medications ?Prior to Admission medications   ?Medication Sig Start Date End Date Taking? Authorizing Provider  ?amoxicillin (AMOXIL) 500 MG capsule Take 1 capsule (500 mg total) by mouth 3 (three) times daily. 09/30/21  Yes Pollyann Savoy, MD  ?metFORMIN (GLUCOPHAGE) 500 MG tablet Take by mouth. 01/17/21  Yes [provider]  ?lisinopril (ZESTRIL) 10 MG tablet Take 10 mg by mouth daily. 05/01/21   [provider]  ?meclizine (ANTIVERT) 50 MG tablet Take 1 tablet (50 mg total) by mouth 3 (three) times daily as needed. 09/24/20   Linwood Dibbles, MD  ? ? ? ? ?Allergies    ?Patient has no known allergies. ? ? ?Review of Systems   ?Review of Systems ?Please see HPI for pertinent positives and negatives ? ?Physical Exam ?BP 114/62   Pulse 97   Temp 98.1 ?F (36.7 ?C) (Oral)   Resp 15   SpO2 96%  ? ?Physical Exam ?Vitals and nursing note reviewed.  ?Constitutional:   ?   Appearance: Normal appearance.  ?HENT:  ?   Head: Normocephalic and atraumatic.  ?   Nose: Nose normal.  ?   Mouth/Throat:  ?   Mouth: Mucous membranes are moist.  ?Eyes:  ?   Extraocular Movements: Extraocular movements intact.  ?   Conjunctiva/sclera: Conjunctivae normal.  ?Cardiovascular:  ?   Rate  and Rhythm: Normal rate.  ?Pulmonary:  ?   Effort: Pulmonary effort is normal.  ?   Breath sounds: Normal breath sounds.  ?Abdominal:  ?   General: Abdomen is flat.  ?   Palpations: Abdomen is soft.  ?   Tenderness: There is no abdominal tenderness.  ?Musculoskeletal:     ?   General: No swelling. Normal range of motion.  ?   Cervical back: Neck supple.  ?Skin: ?   General: Skin is warm and dry.  ?Neurological:  ?   General: No focal deficit present.  ?   Mental Status: He is alert.  ?Psychiatric:     ?   Mood and Affect: Mood normal.  ? ? ?ED Results / Procedures / Treatments   ?EKG ?EKG Interpretation ? ?Date/Time:  Saturday September 29 2021 20:43:20 EDT ?Ventricular Rate:  103 ?PR Interval:  132 ?QRS Duration: 72 ?QT Interval:  314 ?QTC Calculation: 411 ?R Axis:   89 ?Text Interpretation: Sinus tachycardia Nonspecific T wave abnormality Abnormal ECG When compared with ECG of 24-Sep-2020 08:04, No significant change since last tracing Confirmed by Susy Frizzle [phone]) on 09/30/2021 2:54:57 AM ? ?Procedures ?Procedures ? ?Medications Ordered in the ED ?Medications - No data to display ? ?Initial Impression and Plan ? Patient with frontal headache and recent sinus symptoms likely has acute sinusitis.  Also had some tachycardia earlier could be due to OTC decongestants he took. At the time of my evaluation his symptoms have resolved. Labs done in triage show CBC with mild leukocytosis., BMP and Trop x 2 are normal. I personally viewed the images from radiology studies and agree with radiologist interpretation: CXR is clear. Patient is back to baseline now, not having any further headache or chest pains. HR is normal. Plan discharge with Rx for Amoxil for sinusitis. Advised to avoid stimulant decongestants. PCP follow up.  ? ?ED Course  ? ?  ? ? ?MDM Rules/Calculators/A&P ?Medical Decision Making ?Given presenting complaint, I considered that admission might be necessary. After review of results from ED lab and/or  imaging studies, admission to the hospital is not indicated at this time.  ? ? ?Problems Addressed: ?Atypical chest pain: acute illness or injury ?Sinus headache: acute illness or injury ?Tachycardia: acute illness or injury ? ?Amount and/or Complexity of Data Reviewed ?Labs: ordered. Decision-making details documented in ED Course. ?Radiology: ordered and independent interpretation performed. Decision-making details documented in ED Course. ?ECG/medicine tests: ordered and independent interpretation performed. Decision-making details documented in ED Course. ? ?Risk ?Decision regarding hospitalization. ? ? ? ?Final Clinical Impression(s) / ED Diagnoses ?Final diagnoses:  ?Sinus headache  ?Tachycardia  ?Atypical chest pain  ? ? ?Rx / DC Orders ?ED Discharge Orders   ? ?      Ordered  ?  amoxicillin (AMOXIL) 500 MG capsule  3 times daily       ? 09/30/21 0313  ? ?  ?  ? ?  ? ?  ?Pollyann Savoy, MD ?09/30/21 [phone] ? ?

## 2022-08-30 ENCOUNTER — Encounter (HOSPITAL_COMMUNITY): Payer: Self-pay

## 2022-08-30 ENCOUNTER — Other Ambulatory Visit: Payer: Self-pay

## 2022-08-30 ENCOUNTER — Emergency Department (HOSPITAL_COMMUNITY)
Admission: EM | Admit: 2022-08-30 | Discharge: 2022-08-30 | Disposition: A | Discharge status: Home / Self Care | Payer: BLUE CROSS/BLUE SHIELD | Attending: Emergency Medicine | Admitting: Emergency Medicine

## 2022-08-30 ENCOUNTER — Emergency Department (HOSPITAL_COMMUNITY): Payer: BLUE CROSS/BLUE SHIELD

## 2022-08-30 DIAGNOSIS — R221 Localized swelling, mass and lump, neck: Secondary | ICD-10-CM | POA: Diagnosis present

## 2022-08-30 DIAGNOSIS — Z79899 Other long term (current) drug therapy: Secondary | ICD-10-CM | POA: Diagnosis not present

## 2022-08-30 DIAGNOSIS — I10 Essential (primary) hypertension: Secondary | ICD-10-CM

## 2022-08-30 DIAGNOSIS — Z7984 Long term (current) use of oral hypoglycemic drugs: Secondary | ICD-10-CM | POA: Insufficient documentation

## 2022-08-30 LAB — BASIC METABOLIC PANEL
Anion gap: 10 (ref 5–15)
BUN: 6 mg/dL (ref 6–20)
CO2: 23 mmol/L (ref 22–32)
Calcium: 9.3 mg/dL (ref 8.9–10.3)
Chloride: 106 mmol/L (ref 98–111)
Creatinine, Ser: 1.11 mg/dL (ref 0.61–1.24)
GFR, Estimated: >60 mL/min (ref >60)
Glucose, Bld: 113 mg/dL — ABNORMAL HIGH (ref 70–99)
Potassium: 4.9 mmol/L (ref 3.5–5.1)
Sodium: 139 mmol/L (ref 135–145)

## 2022-08-30 LAB — CBC WITH DIFFERENTIAL/PLATELET
Abs Immature Granulocytes: 0.01 10*3/uL (ref 0.00–0.07)
Basophils Absolute: 0.1 10*3/uL (ref 0.0–0.1)
Basophils Relative: 1 %
Eosinophils Absolute: 0.1 10*3/uL (ref 0.0–0.5)
Eosinophils Relative: 4 %
HCT: 47.8 % (ref 39.0–52.0)
Hemoglobin: 15.9 g/dL (ref 13.0–17.0)
Immature Granulocytes: 0 %
Lymphocytes Relative: 47 %
Lymphs Abs: 1.7 10*3/uL (ref 0.7–4.0)
MCH: 27.6 pg (ref 26.0–34.0)
MCHC: 33.3 g/dL (ref 30.0–36.0)
MCV: 82.8 fL (ref 80.0–100.0)
Monocytes Absolute: 0.3 10*3/uL (ref 0.1–1.0)
Monocytes Relative: 9 %
Neutro Abs: 1.4 10*3/uL — ABNORMAL LOW (ref 1.7–7.7)
Neutrophils Relative %: 39 %
Platelets: 229 10*3/uL (ref 150–400)
RBC: 5.77 MIL/uL (ref 4.22–5.81)
RDW: 13 % (ref 11.5–15.5)
WBC: 3.6 10*3/uL — ABNORMAL LOW (ref 4.0–10.5)
nRBC: 0 % (ref 0.0–0.2)

## 2022-08-30 MED ORDER — AMOXICILLIN 500 MG PO CAPS: 500.0000 mg | ORAL_CAPSULE | Freq: Three times a day (TID) | ORAL | 0 refills | Status: DC

## 2022-08-30 MED ORDER — IOHEXOL 350 MG/ML SOLN
75.0000 mL | Freq: Once | INTRAVENOUS | Status: AC | PRN
  Administered 2022-08-30: 75 mL via INTRAVENOUS

## 2022-08-30 MED ORDER — LISINOPRIL 10 MG PO TABS: 10.0000 mg | ORAL_TABLET | Freq: Every day | ORAL | 3 refills | Status: DC

## 2022-08-30 NOTE — Discharge Instructions (Signed)
You were seen in the emergency department for evaluation of some left-sided neck swelling.  You had lab work and a CAT scan of your neck that did not show a definite explanation for your symptoms.  We are starting you on some antibiotics in case there is possible infection in your gland or lymph nodes.  You can use Tylenol and ibuprofen for pain.  Your blood pressure was also elevated here and this will need follow-up with primary care doctor.  Return to the emergency department if any worsening or concerning symptoms.

## 2022-08-30 NOTE — ED Notes (Signed)
This RN reviewed discharge instructions with patient. He verbalized understanding and denied any further questions. Pt well appearing upon discharge and reports tolerable pain. Pt ambulated with stable gait to exit. Pt endorses ride home.  

## 2022-08-30 NOTE — ED Provider Notes (Signed)
[place] Provider Note   CSN: [id number] Arrival date & time: 08/30/22  0957     History {Add pertinent medical, surgical, social history, OB history to HPI:1} Chief Complaint  Patient presents with   Neck Swelling     Travis Hatfield. is a 42 y.o. male.  He is just difficult past medical history.  He is complaining of some swelling of the left side of his neck that is been going on for a month.  He feels it when he swallows but does not have a sore throat or difficulty swallowing.  No tooth pain ear pain fevers chills.  Denies any trauma.  Has tried nothing for it.  The history is provided by the patient.  Neck Injury This is a new problem. Episode onset: 1 month. The problem occurs constantly. The problem has not changed since onset.Pertinent negatives include no chest pain, no abdominal pain, no headaches and no shortness of breath. Exacerbated by: Palpation. Nothing relieves the symptoms. He has tried nothing for the symptoms. The treatment provided no relief.       Home Medications Prior to Admission medications   Medication Sig Start Date End Date Taking? Authorizing Provider  amoxicillin (AMOXIL) 500 MG capsule Take 1 capsule (500 mg total) by mouth 3 (three) times daily. 09/30/21   Truddie Hidden, MD  lisinopril (ZESTRIL) 10 MG tablet Take 10 mg by mouth daily. 05/01/21   [provider]  meclizine (ANTIVERT) 50 MG tablet Take 1 tablet (50 mg total) by mouth 3 (three) times daily as needed. 09/24/20   Dorie Rank, MD  metFORMIN (GLUCOPHAGE) 500 MG tablet Take by mouth. 01/17/21   [provider]      Allergies    Patient has no known allergies.    Review of Systems   Review of Systems  Constitutional:  Negative for fever.  HENT:  Negative for sore throat.   Eyes:  Negative for visual disturbance.  Respiratory:  Negative for shortness of breath.   Cardiovascular:  Negative for chest pain.   Gastrointestinal:  Negative for abdominal pain.  Musculoskeletal:  Positive for neck pain. Negative for neck stiffness.  Skin:  Negative for rash.  Neurological:  Negative for headaches.    Physical Exam Updated Vital Signs BP (!) 185/98   Pulse 84   Temp 98.4 F (36.9 C) (Oral)   Resp 18   Ht 5\' 6"  (1.676 m)   Wt 120.2 kg   SpO2 100%   BMI 42.77 kg/m  Physical Exam Vitals and nursing note reviewed.  Constitutional:      General: He is not in acute distress.    Appearance: Normal appearance. He is well-developed.  HENT:     Head: Normocephalic and atraumatic.     Left Ear: Tympanic membrane normal.     Mouth/Throat:     Mouth: Mucous membranes are moist.     Pharynx: Oropharynx is clear. No oropharyngeal exudate or posterior oropharyngeal erythema.  Eyes:     Conjunctiva/sclera: Conjunctivae normal.  Neck:     Comments: There is no jaw pain no TMJ tenderness.  He has some fullness in the left lateral neck.  No significant adenopathy.  Normal voice no trismus.  No overlying skin changes. Cardiovascular:     Rate and Rhythm: Normal rate and regular rhythm.     Heart sounds: No murmur heard. Pulmonary:     Effort: Pulmonary effort is normal. No respiratory distress.  Breath sounds: Normal breath sounds.  Abdominal:     Palpations: Abdomen is soft.     Tenderness: There is no abdominal tenderness.  Musculoskeletal:        General: No deformity.     Cervical back: Neck supple. Tenderness present.  Skin:    General: Skin is warm and dry.     Capillary Refill: Capillary refill takes less than 2 seconds.  Neurological:     General: No focal deficit present.     Mental Status: He is alert.     ED Results / Procedures / Treatments   Labs (all labs ordered are listed, but only abnormal results are displayed) Labs Reviewed - No data to display  EKG None  Radiology No results found.  Procedures Procedures  {Document cardiac monitor, telemetry assessment  procedure when appropriate:1}  Medications Ordered in ED Medications - No data to display  ED Course/ Medical Decision Making/ A&P   {   Click here for ABCD2, HEART and other calculatorsREFRESH Note before signing :1}                          Medical Decision Making Amount and/or Complexity of Data Reviewed Labs: ordered. Radiology: ordered.   This patient complains of ***; this involves an extensive number of treatment Options and is a complaint that carries with it a high risk of complications and morbidity. The differential includes ***  I ordered, reviewed and interpreted labs, which included *** I ordered medication *** and reviewed PMP when indicated. I ordered imaging studies which included *** and I independently    visualized and interpreted imaging which showed *** Additional history obtained from *** Previous records obtained and reviewed *** I consulted *** and discussed lab and imaging findings and discussed disposition.  Cardiac monitoring reviewed, *** Social determinants considered, *** Critical Interventions: ***  After the interventions stated above, I reevaluated the patient and found *** Admission and further testing considered, ***   {Document critical care time when appropriate:1} {Document review of labs and clinical decision tools ie heart score, Chads2Vasc2 etc:1}  {Document your independent review of radiology images, and any outside records:1} {Document your discussion with family members, caretakers, and with consultants:1} {Document social determinants of health affecting pt's care:1} {Document your decision making why or why not admission, treatments were needed:1} Final Clinical Impression(s) / ED Diagnoses Final diagnoses:  None    Rx / DC Orders ED Discharge Orders     None

## 2022-08-30 NOTE — ED Triage Notes (Signed)
Pt came in vi POV d/t neck swelling that started 3 weeks ago. Lt side of neck has swelling noted while in triage, dt denies difficulty breathing/swallowing or ear ache/sore throat, or any recent falls/injuries. A/Ox4, rate pain 6/10.

## 2022-09-01 ENCOUNTER — Telehealth: Payer: PRIVATE HEALTH INSURANCE | Admitting: Nurse Practitioner

## 2022-09-01 NOTE — Progress Notes (Signed)
Missed video visit. Emailed patient instructions to scheduling with a primary care provider. Mychart unavailable for patient at this time

## 2022-12-29 ENCOUNTER — Other Ambulatory Visit: Payer: Self-pay

## 2022-12-29 ENCOUNTER — Emergency Department (HOSPITAL_COMMUNITY)
Admission: EM | Admit: 2022-12-29 | Discharge: 2022-12-29 | Disposition: A | Discharge status: Home / Self Care | Payer: BLUE CROSS/BLUE SHIELD | Attending: Emergency Medicine | Admitting: Emergency Medicine

## 2022-12-29 ENCOUNTER — Encounter (HOSPITAL_COMMUNITY): Payer: Self-pay

## 2022-12-29 DIAGNOSIS — B309 Viral conjunctivitis, unspecified: Secondary | ICD-10-CM | POA: Insufficient documentation

## 2022-12-29 DIAGNOSIS — H5789 Other specified disorders of eye and adnexa: Secondary | ICD-10-CM | POA: Diagnosis present

## 2022-12-29 MED ORDER — ERYTHROMYCIN 5 MG/GM OP OINT: TOPICAL_OINTMENT | OPHTHALMIC | 0 refills | Status: DC

## 2022-12-29 MED ORDER — FEXOFENADINE HCL 60 MG PO TABS: 60.0000 mg | ORAL_TABLET | Freq: Two times a day (BID) | ORAL | 0 refills | Status: DC

## 2022-12-29 MED ORDER — DIPHENHYDRAMINE HCL 25 MG PO CAPS
25.0000 mg | ORAL_CAPSULE | Freq: Once | ORAL | Status: AC
  Administered 2022-12-29: 25 mg via ORAL
  Filled 2022-12-29: qty 1

## 2022-12-29 MED ORDER — ARTIFICIAL TEARS OPHTHALMIC OINT
1.0000 | TOPICAL_OINTMENT | Freq: Once | OPHTHALMIC | Status: AC
  Administered 2022-12-29: 1 via OPHTHALMIC
  Filled 2022-12-29: qty 3.5

## 2022-12-29 NOTE — ED Provider Notes (Signed)
[place] EMERGENCY DEPARTMENT AT [facility] Provider Note   CSN: [id number] Arrival date & time: 12/29/22  1531     History  Chief Complaint  Patient presents with   Conjunctivitis    Travis Hatfield. is a 42 y.o. male.  HPI    SUBJECTIVE:  42 y.o. male with burning, redness, discharge and mattering in left eye for 2 days.  No other symptoms.  No significant prior ophthalmological history. No change in visual acuity, no photophobia, no severe eye pain.     Home Medications Prior to Admission medications   Medication Sig Start Date End Date Taking? Authorizing Provider  erythromycin ophthalmic ointment Place a 1/2 inch ribbon of ointment into the lower eyelid. 12/29/22  Yes Derwood Kaplan, MD  fexofenadine (ALLEGRA) 60 MG tablet Take 1 tablet (60 mg total) by mouth 2 (two) times daily. 12/29/22  Yes Derwood Kaplan, MD  amoxicillin (AMOXIL) 500 MG capsule Take 1 capsule (500 mg total) by mouth 3 (three) times daily. 08/30/22   Terrilee Files, MD  lisinopril (ZESTRIL) 10 MG tablet Take 1 tablet (10 mg total) by mouth daily. 08/30/22   Terrilee Files, MD  meclizine (ANTIVERT) 50 MG tablet Take 1 tablet (50 mg total) by mouth 3 (three) times daily as needed. 09/24/20   Linwood Dibbles, MD  metFORMIN (GLUCOPHAGE) 500 MG tablet Take by mouth. 01/17/21   [provider]      Allergies    Patient has no known allergies.    Review of Systems   Review of Systems  Physical Exam Updated Vital Signs BP (!) 139/96   Pulse 74   Temp 98.9 F (37.2 C)   Resp 16   Ht 5\' 6"  (1.676 m)   Wt 113.4 kg   SpO2 96%   BMI 40.35 kg/m  Physical Exam Vitals and nursing note reviewed.  Constitutional:      Appearance: He is well-developed.  HENT:     Head: Atraumatic.  Eyes:     General:        Left eye: Discharge present.    Extraocular Movements: Extraocular movements intact.  Cardiovascular:     Rate and Rhythm: Normal rate.  Pulmonary:     Effort:  Pulmonary effort is normal.  Musculoskeletal:     Cervical back: Neck supple.  Skin:    General: Skin is warm.  Neurological:     Mental Status: He is alert and oriented to person, place, and time.     ED Results / Procedures / Treatments   Labs (all labs ordered are listed, but only abnormal results are displayed) Labs Reviewed - No data to display  EKG None  Radiology No results found.  Procedures Procedures    Medications Ordered in ED Medications  artificial tears (LACRILUBE) ophthalmic ointment 1 Application (has no administration in time range)  diphenhydrAMINE (BENADRYL) capsule 25 mg (25 mg Oral Given 12/29/22 2024)    ED Course/ Medical Decision Making/ A&P                             Medical Decision Making Risk OTC drugs. Prescription drug management.   OBJECTIVE:  Patient appears well, vitals signs are normal. Eyes: left eye with findings of typical conjunctivitis noted; erythema and discharge. PERRLA, no foreign body noted. No periorbital cellulitis. The corneas are clear and fundi normal. Visual acuity normal.   ASSESSMENT:  Conjunctivitis - probably bacterial  PLAN:  Antibiotic drops per order. Hygiene discussed. If other family members develop same condition, may use same medication for them if they are not known to be allergic to it. Call prn. Final Clinical Impression(s) / ED Diagnoses Final diagnoses:  Acute viral conjunctivitis of left eye    Rx / DC Orders ED Discharge Orders          Ordered    erythromycin ophthalmic ointment        12/29/22 2036    fexofenadine (ALLEGRA) 60 MG tablet  2 times daily        12/29/22 2036              Derwood Kaplan, MD 12/29/22 2038

## 2022-12-29 NOTE — ED Triage Notes (Signed)
Pt reports he thinks he has pink eye, pain and redness to both eyes, worse on left, onset yesterday; associated with drainage from both eyes.

## 2022-12-29 NOTE — Discharge Instructions (Addendum)
We suspect that you have a viral conjunctivitis. Apply the artificial tears every couple hours for symptom management.  You may take Allegra or any other antihistamine medications for itchiness.  Take the antibiotics in 3 days only if your symptoms are worsening.

## 2023-03-08 ENCOUNTER — Other Ambulatory Visit: Payer: Self-pay

## 2023-03-08 ENCOUNTER — Encounter (HOSPITAL_COMMUNITY): Payer: Self-pay

## 2023-03-08 ENCOUNTER — Emergency Department (HOSPITAL_COMMUNITY)
Admission: EM | Admit: 2023-03-08 | Discharge: 2023-03-08 | Disposition: A | Discharge status: Home / Self Care | Payer: BLUE CROSS/BLUE SHIELD | Attending: Emergency Medicine | Admitting: Emergency Medicine

## 2023-03-08 DIAGNOSIS — Z79899 Other long term (current) drug therapy: Secondary | ICD-10-CM | POA: Insufficient documentation

## 2023-03-08 DIAGNOSIS — E119 Type 2 diabetes mellitus without complications: Secondary | ICD-10-CM | POA: Insufficient documentation

## 2023-03-08 DIAGNOSIS — I10 Essential (primary) hypertension: Secondary | ICD-10-CM | POA: Insufficient documentation

## 2023-03-08 DIAGNOSIS — Z7984 Long term (current) use of oral hypoglycemic drugs: Secondary | ICD-10-CM | POA: Diagnosis not present

## 2023-03-08 DIAGNOSIS — R519 Headache, unspecified: Secondary | ICD-10-CM | POA: Diagnosis present

## 2023-03-08 DIAGNOSIS — G44209 Tension-type headache, unspecified, not intractable: Secondary | ICD-10-CM

## 2023-03-08 LAB — URINALYSIS, ROUTINE W REFLEX MICROSCOPIC
Bilirubin Urine: NEGATIVE
Glucose, UA: NEGATIVE mg/dL
Hgb urine dipstick: NEGATIVE
Ketones, ur: NEGATIVE mg/dL
Leukocytes,Ua: NEGATIVE
Nitrite: NEGATIVE
Protein, ur: NEGATIVE mg/dL
Specific Gravity, Urine: 1.021 (ref 1.005–1.030)
pH: 5 (ref 5.0–8.0)

## 2023-03-08 LAB — BASIC METABOLIC PANEL
Anion gap: 7 (ref 5–15)
BUN: 12 mg/dL (ref 6–20)
CO2: 24 mmol/L (ref 22–32)
Calcium: 8.7 mg/dL — ABNORMAL LOW (ref 8.9–10.3)
Chloride: 105 mmol/L (ref 98–111)
Creatinine, Ser: 1.01 mg/dL (ref 0.61–1.24)
GFR, Estimated: >60 mL/min (ref >60)
Glucose, Bld: 133 mg/dL — ABNORMAL HIGH (ref 70–99)
Potassium: 4 mmol/L (ref 3.5–5.1)
Sodium: 136 mmol/L (ref 135–145)

## 2023-03-08 LAB — CBC
HCT: 46.9 % (ref 39.0–52.0)
Hemoglobin: 15.4 g/dL (ref 13.0–17.0)
MCH: 27.1 pg (ref 26.0–34.0)
MCHC: 32.8 g/dL (ref 30.0–36.0)
MCV: 82.6 fL (ref 80.0–100.0)
Platelets: 216 10*3/uL (ref 150–400)
RBC: 5.68 MIL/uL (ref 4.22–5.81)
RDW: 12.7 % (ref 11.5–15.5)
WBC: 4.1 10*3/uL (ref 4.0–10.5)
nRBC: 0 % (ref 0.0–0.2)

## 2023-03-08 MED ORDER — DIPHENHYDRAMINE HCL 50 MG/ML IJ SOLN
25.0000 mg | Freq: Once | INTRAMUSCULAR | Status: AC
  Administered 2023-03-08: 25 mg via INTRAVENOUS
  Filled 2023-03-08: qty 1

## 2023-03-08 MED ORDER — LIDOCAINE 5 % EX PTCH
1.0000 | MEDICATED_PATCH | CUTANEOUS | Status: DC
  Administered 2023-03-08: 1 via TRANSDERMAL
  Filled 2023-03-08: qty 1

## 2023-03-08 MED ORDER — METOCLOPRAMIDE HCL 5 MG/ML IJ SOLN
10.0000 mg | Freq: Once | INTRAMUSCULAR | Status: AC
  Administered 2023-03-08: 10 mg via INTRAVENOUS
  Filled 2023-03-08: qty 2

## 2023-03-08 MED ORDER — SODIUM CHLORIDE 0.9 % IV BOLUS
500.0000 mL | Freq: Once | INTRAVENOUS | Status: AC
  Administered 2023-03-08: 500 mL via INTRAVENOUS

## 2023-03-08 MED ORDER — KETOROLAC TROMETHAMINE 15 MG/ML IJ SOLN
15.0000 mg | Freq: Once | INTRAMUSCULAR | Status: AC
  Administered 2023-03-08: 15 mg via INTRAVENOUS
  Filled 2023-03-08: qty 1

## 2023-03-08 NOTE — Discharge Instructions (Addendum)
Please follow-up with your primary care doctor, I believe your headache was likely secondary to tension headache, make sure you are drinking lots of fluids, and resting.  It is very important for you to follow-up with a primary care doctor, call the number below, to make an appointment with a primary care doctor, or you can follow-up with [facility] health community health and wellness.  Return to the ER if you have any weakness, difficulties with speech, or confusion.  Thank you for the opportunity to take care of you in our Emergency Department. You have been diagnosed with high blood pressure, also known as hypertension. This means that the force of blood against the walls of your blood vessels called is too strong. It also means that your heart has to work harder to move the blood. High blood pressure usually has no symptoms, but over time, it can cause serious health problems such as Heart attack and heart failure Stroke Kidney disease and failure Vision loss With the help from your healthcare provider and some important life style changes, you can manage your blood pressure and protect your health. Please read the instructions provided on hypertension, how to manage it and how to check your blood pressure. Additionally, use the blood pressure log provided to record your blood pressures. Take the blood pressure log with you to your primary care doctor so that they can adjust your blood pressure medications if needed. Please read the instructions on follow-up appointment. Return to the ER or Call 911 right away if you have any of these symptoms: Chest pain or shortness of breath Severe headache Weakness, tingling, or numbness of your face, arms, or legs (especially on 1 side of the body) Sudden change in vision Confusion, trouble speaking, or trouble understanding speech

## 2023-03-08 NOTE — ED Provider Notes (Signed)
[place] EMERGENCY DEPARTMENT AT [facility] Provider Note   CSN: [id number] Arrival date & time: 03/08/23  0930     History  Chief Complaint  Patient presents with   Hypertension   Headache    Travis Hatfield. is a 42 y.o. male, history of diabetes, OSA, who presents to the ED secondary to headache, and elevated blood pressure, this been going on for the last few days.  He states he is out of 5 blood pressure last couple days, and then noticed that he had a headache, all around his head, today that felt just a little bit different than usual.  States pain is 7 out of 10, squeezing in nature.  He states that he is not have any vision changes, weakness on one side of the body, or difficulty with speech.  Denies any chest pain, shortness of breath, or nausea or vomiting.  Notes that he is not on the blood pressure medication, but there is a vague history of blood pressure and problems in his family.  Home Medications Prior to Admission medications   Medication Sig Start Date End Date Taking? Authorizing Provider  amoxicillin (AMOXIL) 500 MG capsule Take 1 capsule (500 mg total) by mouth 3 (three) times daily. 08/30/22   Terrilee Files, MD  erythromycin ophthalmic ointment Place a 1/2 inch ribbon of ointment into the lower eyelid. 12/29/22   Derwood Kaplan, MD  fexofenadine (ALLEGRA) 60 MG tablet Take 1 tablet (60 mg total) by mouth 2 (two) times daily. 12/29/22   Derwood Kaplan, MD  lisinopril (ZESTRIL) 10 MG tablet Take 1 tablet (10 mg total) by mouth daily. 08/30/22   Terrilee Files, MD  meclizine (ANTIVERT) 50 MG tablet Take 1 tablet (50 mg total) by mouth 3 (three) times daily as needed. 09/24/20   Linwood Dibbles, MD  metFORMIN (GLUCOPHAGE) 500 MG tablet Take by mouth. 01/17/21   [provider]      Allergies    Patient has no known allergies.    Review of Systems   Review of Systems  Respiratory:  Negative for shortness of breath.   Cardiovascular:   Negative for chest pain.  Neurological:  Positive for headaches.    Physical Exam Updated Vital Signs BP (!) 143/91 (BP Location: Right Arm)   Pulse 78   Temp 98 F (36.7 C) (Oral)   Resp 18   Ht 5\' 6"  (1.676 m)   Wt 119.3 kg   SpO2 100%   BMI 42.45 kg/m  Physical Exam Vitals and nursing note reviewed.  Constitutional:      General: He is not in acute distress.    Appearance: He is well-developed.  HENT:     Head: Normocephalic and atraumatic.  Eyes:     General: No visual field deficit.    Conjunctiva/sclera: Conjunctivae normal.  Cardiovascular:     Rate and Rhythm: Normal rate and regular rhythm.     Heart sounds: No murmur heard. Pulmonary:     Effort: Pulmonary effort is normal. No respiratory distress.     Breath sounds: Normal breath sounds.  Abdominal:     Palpations: Abdomen is soft.     Tenderness: There is no abdominal tenderness.  Musculoskeletal:        General: No swelling.     Cervical back: Neck supple.  Skin:    General: Skin is warm and dry.     Capillary Refill: Capillary refill takes less than 2 seconds.  Neurological:  Mental Status: He is alert.     Cranial Nerves: No cranial nerve deficit, dysarthria or facial asymmetry.     Sensory: No sensory deficit.     Motor: No weakness.  Psychiatric:        Mood and Affect: Mood normal.     ED Results / Procedures / Treatments   Labs (all labs ordered are listed, but only abnormal results are displayed) Labs Reviewed  BASIC METABOLIC PANEL - Abnormal; Notable for the following components:      Result Value   Glucose, Bld 133 (*)    Calcium 8.7 (*)    All other components within normal limits  CBC  URINALYSIS, ROUTINE W REFLEX MICROSCOPIC    EKG None  Radiology No results found.  Procedures Procedures    Medications Ordered in ED Medications  lidocaine (LIDODERM) 5 % 1 patch (1 patch Transdermal Patch Applied 03/08/23 1129)  ketorolac (TORADOL) 15 MG/ML injection 15 mg (15 mg  Intravenous Given 03/08/23 1119)  metoCLOPramide (REGLAN) injection 10 mg (10 mg Intravenous Given 03/08/23 1127)  diphenhydrAMINE (BENADRYL) injection 25 mg (25 mg Intravenous Given 03/08/23 1116)  sodium chloride 0.9 % bolus 500 mL (0 mLs Intravenous Stopped 03/08/23 1221)    ED Course/ Medical Decision Making/ A&P                                 Medical Decision Making Patient is a 42 year old male, here for headache, started today, states he has a headache, and is concerned because his blood pressure has been a little bit high for the last couple days.  He does not know how high it has been though, but he states it was 180 over something at triage.  He denies any kind of vision changes, confusion, weakness on one side of the body.  He has no neurodeficits thus we will hold off on his head CT at this time.  He is not complaining of any chest pain or shortness of breath, but he states that he has been urinating more frequently and would like to have his urine tested.  Amount and/or Complexity of Data Reviewed Labs: ordered.    Details: Unremarkable ECG/medicine tests:  Decision-making details documented in ED Course. Discussion of management or test interpretation with external provider(s): Discussed with patient, he is well-appearing, his blood pressure and it was only 140/80, when I evaluated him initially.  Believe that his headache is likely tension related, as it is global, and squeezing.  It resolved after a headache cocktail.  He does not have any chest pain, shortness of breath, vision changes as I think the CAT scan further blood work is unnecessary at this time.  His urine was unremarkable.  He is informed that he will need to follow-up with primary care doctor, given his blood pressure was fairly good, but still hypertensive, I did not elect to start him on a hypertensive med.  However I will have him follow-up with the pharmacy as he has no primary care doctor.  He was informed that he  will need to call the number, and make an appointment with the primary care doctor and to keep a log of his blood pressures at home.  Risk Prescription drug management.    Final Clinical Impression(s) / ED Diagnoses Final diagnoses:  Hypertension, unspecified type  Acute non intractable tension-type headache    Rx / DC Orders ED Discharge Orders  None         Malayiah Mcbrayer, [place], [place] 03/08/23 1249    Derwood Kaplan, MD 03/09/23 [phone]

## 2023-03-08 NOTE — ED Triage Notes (Signed)
Pt c.o HTN and HA. 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 Pt woke up this morning with a headache and his BP was 161/87.  Pt also c.o left lower back pain for a while.

## 2023-03-10 ENCOUNTER — Other Ambulatory Visit: Payer: BLUE CROSS/BLUE SHIELD | Admitting: Pharmacist

## 2023-03-10 ENCOUNTER — Telehealth: Payer: Self-pay | Admitting: Pharmacist

## 2023-03-10 NOTE — Progress Notes (Signed)
Contacted patient regarding referral for hypertension from Emergency Room  Outreached patient to discuss hypertension control and medication management.   Current antihypertensives: prescribed lisinopril 10 mg daily at ED visit. He was able to pick this up  Patient has an automated upper arm home BP machine and notes he can start checking.   Patient denies hypotensive signs and symptoms including dizziness, lightheadedness.  Patient denies hypertensive symptoms including headache, chest pain, shortness of breath.  Was given phone number for [facility] and Wellness at ED Discharge, though they do not have new patient availability until January. Provided phone number for [facility].   Assessment/Plan: - Currently uncontrolled - - Reviewed goal blood pressure <130/80 - Reviewed appropriate administration of medication regimen - Counseled on long term microvascular and macrovascular complications of uncontrolled hypertension - Reviewed to check blood pressure daily, document, and provide at next provider visit  Patient will call [facility] to schedule new patient appointment.   Catie Eppie Gibson, PharmD, BCACP, CPP Clinical Pharmacist [facility] [phone]

## 2023-03-10 NOTE — Progress Notes (Signed)
Contacted patient regarding referral for hypertension from Dr. Rhunette Croft   Left patient a voicemail to return my call at their convenience   Catie TClearance Coots, PharmD, BCACP, CPP Clinical Pharmacist [facility] [phone]

## 2023-03-17 ENCOUNTER — Encounter: Payer: Self-pay | Admitting: Nurse Practitioner

## 2023-03-17 ENCOUNTER — Ambulatory Visit (INDEPENDENT_AMBULATORY_CARE_PROVIDER_SITE_OTHER): Payer: BLUE CROSS/BLUE SHIELD | Admitting: Nurse Practitioner

## 2023-03-17 VITALS — BP 134/63 | HR 90 | Temp 96.0°F | Ht 66.0 in | Wt 274.0 lb

## 2023-03-17 DIAGNOSIS — E66813 Obesity, class 3: Secondary | ICD-10-CM | POA: Insufficient documentation

## 2023-03-17 DIAGNOSIS — I10 Essential (primary) hypertension: Secondary | ICD-10-CM | POA: Diagnosis not present

## 2023-03-17 DIAGNOSIS — E119 Type 2 diabetes mellitus without complications: Secondary | ICD-10-CM | POA: Diagnosis not present

## 2023-03-17 DIAGNOSIS — E785 Hyperlipidemia, unspecified: Secondary | ICD-10-CM

## 2023-03-17 DIAGNOSIS — Z6841 Body Mass Index (BMI) 40.0 and over, adult: Secondary | ICD-10-CM

## 2023-03-17 LAB — POCT GLYCOSYLATED HEMOGLOBIN (HGB A1C): Hemoglobin A1C: 6.4 % — AB (ref 4.0–5.6)

## 2023-03-17 MED ORDER — LANCETS MISC. MISC: 1.0000 | Freq: Two times a day (BID) | 0 refills | Status: AC

## 2023-03-17 MED ORDER — BLOOD GLUCOSE TEST VI STRP: 1.0000 | ORAL_STRIP | Freq: Two times a day (BID) | 4 refills | Status: AC

## 2023-03-17 MED ORDER — BLOOD GLUCOSE MONITORING SUPPL DEVI: 1.0000 | Freq: Two times a day (BID) | 0 refills | Status: DC

## 2023-03-17 MED ORDER — LISINOPRIL 10 MG PO TABS: 10.0000 mg | ORAL_TABLET | Freq: Every day | ORAL | 1 refills | Status: DC

## 2023-03-17 MED ORDER — LANCET DEVICE MISC: 1.0000 | Freq: Two times a day (BID) | 0 refills | Status: AC

## 2023-03-17 MED ORDER — SEMAGLUTIDE(0.25 OR 0.5MG/DOS) 2 MG/3ML ~~LOC~~ SOPN: 0.2500 mg | PEN_INJECTOR | SUBCUTANEOUS | 0 refills | Status: DC

## 2023-03-17 NOTE — Progress Notes (Signed)
New Patient Office Visit  Subjective:  Patient ID: Travis Zorn., male    DOB: 07/23/1980  Age: 42 y.o. MRN: [id number]  CC:  Chief Complaint  Patient presents with   Hospitalization Follow-up   Establish Care    HPI Travis Sharman. is a 42 y.o. male  has a past medical history of Asthma, Dyslipidemia, goal LDL below 70 (09/29/2020), Hyperlipidemia, OSA (obstructive sleep apnea) (05/18/2021), Primary hypertension (05/18/2021), Sleep apnea (09/2020), and Type 2 diabetes mellitus without complication, without long-term current use of insulin (HCC) (09/29/2020).  Patient presented establish care for his chronic medical conditions.  Previous PCP was at [facility] in [place].  Patient was at the mild on 03/08/2023 for complaints of headache and elevated blood pressure.  His headache has since resolved, takes lisinopril 10 mg daily for hypertension.  He is on a general diet, does not exercise and does not monitor his blood pressure.    Past Medical History:  Diagnosis Date   Asthma    Dyslipidemia, goal LDL below 70 09/29/2020   Hyperlipidemia    OSA (obstructive sleep apnea) 05/18/2021   Primary hypertension 05/18/2021   Sleep apnea 09/2020   Type 2 diabetes mellitus without complication, without long-term current use of insulin (HCC) 09/29/2020    Past Surgical History:  Procedure Laterality Date   HERNIA REPAIR     at 11 years   TONSILLECTOMY      Family History  Problem Relation Age of Onset   Diabetes Mother    Breast cancer Mother    Stroke Sister    Lupus Sister    Kidney disease Sister    Prostate cancer Maternal Grandfather    Diabetes Maternal Grandfather     Social History   Socioeconomic History   Marital status: Married    Spouse name: Not on file   Number of children: 3   Years of education: Not on file   Highest education level: Not on file  Occupational History   Not on file  Tobacco Use   Smoking status: Former    Current  packs/day: 0.00    Average packs/day: 0.3 packs/day for 4.0 years (1.0 ttl pk-yrs)    Types: Cigarettes    Quit date: 11/03/2013    Years since quitting: 9.3   Smokeless tobacco: Never  Substance and Sexual Activity   Alcohol use: No   Drug use: No   Sexual activity: Yes  Other Topics Concern   Not on file  Social History Narrative   Lives with his wife   Social Determinants of Health   Financial Resource Strain: Not on file  Food Insecurity: No Food Insecurity (09/28/2020)   Received from [facility], [facility]   Hunger Vital Sign    Worried About Running Out of Food in the Last Year: Never true    Ran Out of Food in the Last Year: Never true  Transportation Needs: Not on file  Physical Activity: Not on file  Stress: Not on file  Social Connections: Unknown (10/16/2021)   Received from [facility], [facility]   Social Network    Social Network: Not on file  Intimate Partner Violence: Unknown (09/07/2021)   Received from [facility], [facility]   HITS    Physically Hurt: Not on file    Insult or Talk Down To: Not on file    Threaten Physical Harm: Not on file    Scream or Curse: Not on file  ROS Review of Systems  Constitutional:  Negative for appetite change, chills, fatigue and fever.  HENT:  Negative for congestion, postnasal drip, rhinorrhea and sneezing.   Respiratory:  Negative for cough, shortness of breath and wheezing.   Cardiovascular:  Negative for chest pain, palpitations and leg swelling.  Gastrointestinal:  Negative for abdominal pain, constipation, nausea and vomiting.  Genitourinary:  Negative for difficulty urinating, dysuria, flank pain and frequency.  Musculoskeletal:  Negative for arthralgias, back pain, joint swelling and myalgias.  Skin:  Negative for color change, pallor, rash and wound.  Neurological:  Negative for dizziness, facial asymmetry, weakness, numbness and headaches.  Psychiatric/Behavioral:  Negative for behavioral  problems, confusion, self-injury and suicidal ideas.     Objective:   Today's Vitals: BP 134/63   Pulse 90   Temp (!) 96 F (35.6 C)   Ht 5\' 6"  (1.676 m)   Wt 274 lb (124.3 kg)   SpO2 100%   BMI 44.22 kg/m   Physical Exam Vitals and nursing note reviewed.  Constitutional:      General: He is not in acute distress.    Appearance: Normal appearance. He is obese. He is not ill-appearing, toxic-appearing or diaphoretic.  HENT:     Mouth/Throat:     Mouth: Mucous membranes are moist.     Pharynx: Oropharynx is clear. No oropharyngeal exudate or posterior oropharyngeal erythema.  Eyes:     General: No scleral icterus.       Right eye: No discharge.        Left eye: No discharge.     Extraocular Movements: Extraocular movements intact.     Conjunctiva/sclera: Conjunctivae normal.  Cardiovascular:     Rate and Rhythm: Normal rate and regular rhythm.     Pulses: Normal pulses.     Heart sounds: Normal heart sounds. No murmur heard.    No friction rub. No gallop.  Pulmonary:     Effort: Pulmonary effort is normal. No respiratory distress.     Breath sounds: Normal breath sounds. No stridor. No wheezing, rhonchi or rales.  Chest:     Chest wall: No tenderness.  Abdominal:     General: There is no distension.     Palpations: Abdomen is soft.     Tenderness: There is no abdominal tenderness. There is no right CVA tenderness, left CVA tenderness or guarding.  Musculoskeletal:        General: No swelling, tenderness, deformity or signs of injury.     Right lower leg: No edema.     Left lower leg: No edema.  Skin:    General: Skin is warm and dry.     Capillary Refill: Capillary refill takes less than 2 seconds.     Coloration: Skin is not jaundiced or pale.     Findings: No bruising, erythema or lesion.  Neurological:     Mental Status: He is alert and oriented to person, place, and time.     Motor: No weakness.     Coordination: Coordination normal.     Gait: Gait normal.   Psychiatric:        Mood and Affect: Mood normal.        Behavior: Behavior normal.        Thought Content: Thought content normal.        Judgment: Judgment normal.     Assessment & Plan:   Problem List Items Addressed This Visit       Cardiovascular and Mediastinum   Primary hypertension  BP Readings from Last 3 Encounters:  03/17/23 134/63  03/08/23 138/79  12/29/22 (!) 139/96   HTN on lisinopril 10 mg daily Blood pressure goal is less than 130/80 Continue current medications. No changes in management. Discussed DASH diet and dietary sodium restrictions Continue to increase dietary efforts and exercise.  Follow-up in 4 weeks        Relevant Medications   lisinopril (ZESTRIL) 10 MG tablet     Endocrine   Type 2 diabetes mellitus without complication, without long-term current use of insulin (HCC) - Primary    Lab Results  Component Value Date   HGBA1C 6.4 (A) 03/17/2023  Stated that he was on metformin in the past but stopped taking the medication due to diarrhea Is interested in starting Ozempic as this medication will also help him to lose some weight.  He denies personal or family history of MTC, M EN 2, denies history of pancreatitis Start Ozempic 0.25 mg once weekly injection, encouraged to avoid fatty fried foods, eat smaller portion of meals to help prevent nausea Diabetic foot exam completed Not on a statin checking lipid panel Referral for diabetic eye exam placed Checking urine microalbumin labs Glucometer and supply ordered CBG goals discussed treatment of hypoglycemia discussed Takes lisinopril 10 mg daily for hypertension Follow-up in 4 weeks       Relevant Medications   Semaglutide,0.25 or 0.5MG /DOS, 2 MG/3ML SOPN   lisinopril (ZESTRIL) 10 MG tablet   Other Relevant Orders   POCT glycosylated hemoglobin (Hb A1C) (Completed)   Ambulatory referral to Ophthalmology   Microalbumin/Creatinine Ratio, Urine   Lipid panel     Other    Dyslipidemia, goal LDL below 70    Not on a statin review of his chart shows that he was on Crestor 20 mg daily in the past Checking lipid panel        Relevant Medications   lisinopril (ZESTRIL) 10 MG tablet   Class 3 severe obesity due to excess calories with serious comorbidity and body mass index (BMI) of 40.0 to 44.9 in adult [facility])    Wt Readings from Last 3 Encounters:  03/17/23 274 lb (124.3 kg)  03/08/23 263 lb (119.3 kg)  12/29/22 250 lb (113.4 kg)   Body mass index is 44.22 kg/m.  Patient counseled on low-carb modified diet Encouraged engage in regular moderate to vigorous exercise at least 150 minutes weekly Starting Ozempic for type 2 diabetes which will also assist with weight management      Relevant Medications   Semaglutide,0.25 or 0.5MG /DOS, 2 MG/3ML SOPN    Outpatient Encounter Medications as of 03/17/2023  Medication Sig   Blood Glucose Monitoring Suppl DEVI 1 each by Does not apply route in the morning and at bedtime. May substitute to any manufacturer covered by patient's insurance.   Glucose Blood (BLOOD GLUCOSE TEST STRIPS) STRP 1 each by In Vitro route in the morning and at bedtime. May substitute to any manufacturer covered by patient's insurance.   Lancet Device MISC 1 each by Does not apply route in the morning and at bedtime. May substitute to any manufacturer covered by patient's insurance.   Lancets Misc. MISC 1 each by Does not apply route 2 (two) times daily. May substitute to any manufacturer covered by patient's insurance.   Semaglutide,0.25 or 0.5MG /DOS, 2 MG/3ML SOPN Inject 0.25 mg into the skin once a week.   [DISCONTINUED] lisinopril (ZESTRIL) 10 MG tablet Take 1 tablet (10 mg total) by mouth daily.   lisinopril (ZESTRIL)  10 MG tablet Take 1 tablet (10 mg total) by mouth daily.   metFORMIN (GLUCOPHAGE) 500 MG tablet Take by mouth. (Patient not taking: Reported on 03/10/2023)   [DISCONTINUED] fexofenadine (ALLEGRA) 60 MG tablet Take 1 tablet (60  mg total) by mouth 2 (two) times daily.   No facility-administered encounter medications on file as of 03/17/2023.    Follow-up: Return in about 4 weeks (around 04/14/2023) for HTN, DM.   Donell Beers, FNP

## 2023-03-17 NOTE — Assessment & Plan Note (Addendum)
Not on a statin review of his chart shows that he was on Crestor 20 mg daily in the past Checking lipid panel

## 2023-03-17 NOTE — Patient Instructions (Addendum)
  Goal for fasting blood sugar ranges from 80 to 120 and 2 hours after any meal or at bedtime should be between 130 to 170.   Around 3 times per week, check your blood pressure 2 times per day. once in the morning and once in the evening. The readings should be at least one minute apart. Write down these values and bring them to your next nurse visit/appointment.  When you check your BP, make sure you have been doing something calm/relaxing 5 minutes prior to checking. Both feet should be flat on the floor and you should be sitting. Use your left arm and make sure it is in a relaxed position (on a table), and that the cuff is at the approximate level/height of your heart.   1. Type 2 diabetes mellitus without complication, without long-term current use of insulin (HCC)  - POCT glycosylated hemoglobin (Hb A1C) - Ambulatory referral to Ophthalmology - Microalbumin/Creatinine Ratio, Urine - Lipid panel - Semaglutide,0.25 or 0.5MG /DOS, 2 MG/3ML SOPN; Inject 0.25 mg into the skin once a week.  Dispense: 3 mL; Refill: 0    It is important that you exercise regularly at least 30 minutes 5 times a week as tolerated  Think about what you will eat, plan ahead. Choose " clean, green, fresh or frozen" over canned, processed or packaged foods which are more sugary, salty and fatty. 70 to 75% of food eaten should be vegetables and fruit. Three meals at set times with snacks allowed between meals, but they must be fruit or vegetables. Aim to eat over a 12 hour period , example 7 am to 7 pm, and STOP after  your last meal of the day. Drink water,generally about 64 ounces per day, no other drink is as healthy. Fruit juice is best enjoyed in a healthy way, by EATING the fruit.  Thanks for choosing [facility] we consider it a privelige to serve you.

## 2023-03-17 NOTE — Assessment & Plan Note (Signed)
Wt Readings from Last 3 Encounters:  03/17/23 274 lb (124.3 kg)  03/08/23 263 lb (119.3 kg)  12/29/22 250 lb (113.4 kg)   Body mass index is 44.22 kg/m.  Patient counseled on low-carb modified diet Encouraged engage in regular moderate to vigorous exercise at least 150 minutes weekly Starting Ozempic for type 2 diabetes which will also assist with weight management

## 2023-03-17 NOTE — Assessment & Plan Note (Signed)
BP Readings from Last 3 Encounters:  03/17/23 134/63  03/08/23 138/79  12/29/22 (!) 139/96   HTN on lisinopril 10 mg daily Blood pressure goal is less than 130/80 Continue current medications. No changes in management. Discussed DASH diet and dietary sodium restrictions Continue to increase dietary efforts and exercise.  Follow-up in 4 weeks

## 2023-03-17 NOTE — Assessment & Plan Note (Signed)
Lab Results  Component Value Date   HGBA1C 6.4 (A) 03/17/2023  Stated that he was on metformin in the past but stopped taking the medication due to diarrhea Is interested in starting Ozempic as this medication will also help him to lose some weight.  He denies personal or family history of MTC, M EN 2, denies history of pancreatitis Start Ozempic 0.25 mg once weekly injection, encouraged to avoid fatty fried foods, eat smaller portion of meals to help prevent nausea Diabetic foot exam completed Not on a statin checking lipid panel Referral for diabetic eye exam placed Checking urine microalbumin labs Glucometer and supply ordered CBG goals discussed treatment of hypoglycemia discussed Takes lisinopril 10 mg daily for hypertension Follow-up in 4 weeks

## 2023-03-18 ENCOUNTER — Other Ambulatory Visit: Payer: Self-pay | Admitting: Nurse Practitioner

## 2023-03-18 DIAGNOSIS — E785 Hyperlipidemia, unspecified: Secondary | ICD-10-CM

## 2023-03-18 LAB — LIPID PANEL
Chol/HDL Ratio: 4.1 {ratio} (ref 0.0–5.0)
Cholesterol, Total: 191 mg/dL (ref 100–199)
HDL: 47 mg/dL (ref >39)
LDL Chol Calc (NIH): 128 mg/dL — ABNORMAL HIGH (ref 0–99)
Triglycerides: 88 mg/dL (ref 0–149)
VLDL Cholesterol Cal: 16 mg/dL (ref 5–40)

## 2023-03-18 MED ORDER — ATORVASTATIN CALCIUM 10 MG PO TABS: 10.0000 mg | ORAL_TABLET | Freq: Every day | ORAL | 0 refills | Status: DC

## 2023-03-19 LAB — MICROALBUMIN / CREATININE URINE RATIO
Creatinine, Urine: 208.3 mg/dL
Microalb/Creat Ratio: 7 mg/g{creat} (ref 0–29)
Microalbumin, Urine: 14.3 ug/mL

## 2023-03-20 ENCOUNTER — Telehealth: Payer: Self-pay

## 2023-03-20 ENCOUNTER — Other Ambulatory Visit: Payer: Self-pay

## 2023-03-20 NOTE — Telephone Encounter (Signed)
Pharmacy Patient Advocate Encounter   Received notification from CoverMyMeds that prior authorization for ozempic is required/requested.   Insurance verification completed.   The patient is insured through [facility] .   Per test claim: PA required; PA submitted to [employer] via CoverMyMeds Key/confirmation #/EOC [id number] Status is pending

## 2023-03-20 NOTE — Telephone Encounter (Signed)
PA is needed for pt ozempic. KH

## 2023-03-20 NOTE — Telephone Encounter (Signed)
Pt was advised Kh

## 2023-03-25 ENCOUNTER — Other Ambulatory Visit: Payer: Self-pay

## 2023-03-25 ENCOUNTER — Telehealth: Payer: Self-pay

## 2023-03-25 NOTE — Telephone Encounter (Signed)
Pharmacy Patient Advocate Encounter   Received notification from CoverMyMeds that prior authorization for [facility] is required/requested.   Insurance verification completed.   The patient is insured through [facility] .   Per test claim: PA required; PA submitted to [facility] via CoverMyMeds Key/confirmation #/EOC [facility] Status is pending

## 2023-03-25 NOTE — Telephone Encounter (Signed)
Pharmacy Patient Advocate Encounter  Received notification from [facility] that Prior Authorization for [facility] has been APPROVED from 03/25/2023 to 03/24/2024   PA #/Case ID/Reference #: [id number]

## 2023-03-30 ENCOUNTER — Other Ambulatory Visit: Payer: Self-pay

## 2023-03-30 ENCOUNTER — Emergency Department (HOSPITAL_COMMUNITY)
Admission: EM | Admit: 2023-03-30 | Discharge: 2023-03-30 | Disposition: A | Discharge status: Home / Self Care | Payer: BLUE CROSS/BLUE SHIELD | Attending: Emergency Medicine | Admitting: Emergency Medicine

## 2023-03-30 ENCOUNTER — Encounter (HOSPITAL_COMMUNITY): Payer: Self-pay | Admitting: Pharmacy Technician

## 2023-03-30 DIAGNOSIS — M67432 Ganglion, left wrist: Secondary | ICD-10-CM | POA: Insufficient documentation

## 2023-03-30 DIAGNOSIS — M6740 Ganglion, unspecified site: Secondary | ICD-10-CM

## 2023-03-30 NOTE — ED Provider Notes (Signed)
[place] EMERGENCY DEPARTMENT AT [facility] Provider Note   CSN: [id number] Arrival date & time: 03/30/23  1025     History  No chief complaint on file.   Travis Allegra Travis Ptak. is a 42 y.o. male.  42 year old male with prior medical history as detailed below presents for evaluation.  Patient reports painless cyst to the medial aspect of his left wrist.  Patient reports that this has been present for approximately 2 weeks.  He denies other complaint.  The history is provided by the patient and medical records.       Home Medications Prior to Admission medications   Medication Sig Start Date End Date Taking? Authorizing Provider  atorvastatin (LIPITOR) 10 MG tablet Take 1 tablet (10 mg total) by mouth daily. 03/18/23   Donell Beers, FNP  Blood Glucose Monitoring Suppl DEVI 1 each by Does not apply route in the morning and at bedtime. May substitute to any manufacturer covered by patient's insurance. 03/17/23   [employer], Baird Kay, FNP  Glucose Blood (BLOOD GLUCOSE TEST STRIPS) STRP 1 each by In Vitro route in the morning and at bedtime. May substitute to any manufacturer covered by patient's insurance. 03/17/23 04/16/23  Donell Beers, FNP  Lancet Device MISC 1 each by Does not apply route in the morning and at bedtime. May substitute to any manufacturer covered by patient's insurance. 03/17/23 04/16/23  Donell Beers, FNP  Lancets Misc. MISC 1 each by Does not apply route 2 (two) times daily. May substitute to any manufacturer covered by patient's insurance. 03/17/23 04/16/23  Donell Beers, FNP  lisinopril (ZESTRIL) 10 MG tablet Take 1 tablet (10 mg total) by mouth daily. 03/17/23   Donell Beers, FNP  metFORMIN (GLUCOPHAGE) 500 MG tablet Take by mouth. Patient not taking: Reported on 03/10/2023 01/17/21   [provider]  Semaglutide,0.25 or 0.5MG /DOS, 2 MG/3ML SOPN Inject 0.25 mg into the skin once a week. 03/17/23   Donell Beers, FNP      Allergies    Metformin and related    Review of Systems   Review of Systems  All other systems reviewed and are negative.   Physical Exam Updated Vital Signs BP (!) 141/88   Pulse 79   Temp 98.7 F (37.1 C)   Resp 16   SpO2 100%  Physical Exam Vitals and nursing note reviewed.  Constitutional:      General: He is not in acute distress.    Appearance: Normal appearance. He is well-developed.  HENT:     Head: Normocephalic and atraumatic.  Eyes:     Conjunctiva/sclera: Conjunctivae normal.     Pupils: Pupils are equal, round, and reactive to light.  Cardiovascular:     Rate and Rhythm: Normal rate and regular rhythm.     Heart sounds: Normal heart sounds.  Pulmonary:     Effort: Pulmonary effort is normal. No respiratory distress.     Breath sounds: Normal breath sounds.  Abdominal:     General: There is no distension.     Palpations: Abdomen is soft.     Tenderness: There is no abdominal tenderness.  Musculoskeletal:        General: No deformity. Normal range of motion.     Cervical back: Normal range of motion and neck supple.     Comments: Small ganglion cyst noted to the medial aspect of the left wrist.  No overlying erythema or suggestive of infection.  Skin:  General: Skin is warm and dry.  Neurological:     General: No focal deficit present.     Mental Status: He is alert and oriented to person, place, and time.     ED Results / Procedures / Treatments   Labs (all labs ordered are listed, but only abnormal results are displayed) Labs Reviewed - No data to display  EKG None  Radiology No results found.  Procedures Procedures    Medications Ordered in ED Medications - No data to display  ED Course/ Medical Decision Making/ A&P                                 Medical Decision Making   Medical Screen Complete  This patient presented to the ED with complaint of ganglion cyst to the left wrist.  This complaint  involves an extensive number of treatment options. The initial differential diagnosis includes, but is not limited to, ganglion cyst  This presentation is: Chronic, Self-Limited, Previously Undiagnosed, and Uncertain Prognosis  Patient presents with ganglion cyst to the left wrist.  Patient without additional indication for emergent workup.  Patient referred to hand surgery for definitive treatment of his ganglion cyst.  Importance of close follow-up is stressed.  Strict return precautions given and understood.  Additional history obtained:  External records from outside sources obtained and reviewed including prior ED visits and prior Inpatient records.   Problem List / ED Course:  Ganglion cyst of left wrist   Reevaluation:  After the interventions noted above, I reevaluated the patient and found that they have: stayed the same  Disposition:  After consideration of the diagnostic results and the patients response to treatment, I feel that the patent would benefit from close outpatient follow-up.          Final Clinical Impression(s) / ED Diagnoses Final diagnoses:  Ganglion cyst    Rx / DC Orders ED Discharge Orders     None         Wynetta Fines, MD 03/30/23 [phone]

## 2023-03-30 NOTE — Discharge Instructions (Signed)
Return for any problem.  ?

## 2023-03-30 NOTE — ED Triage Notes (Signed)
Pt here with reports of knot to his L wrist. Denies, pain, injury to area.

## 2023-04-11 DIAGNOSIS — M25532 Pain in left wrist: Secondary | ICD-10-CM | POA: Insufficient documentation

## 2023-04-11 DIAGNOSIS — M67432 Ganglion, left wrist: Secondary | ICD-10-CM | POA: Insufficient documentation

## 2023-04-14 ENCOUNTER — Encounter: Payer: Self-pay | Admitting: Nurse Practitioner

## 2023-04-14 ENCOUNTER — Ambulatory Visit (INDEPENDENT_AMBULATORY_CARE_PROVIDER_SITE_OTHER): Payer: BLUE CROSS/BLUE SHIELD | Admitting: Nurse Practitioner

## 2023-04-14 VITALS — BP 121/65 | HR 72 | Temp 97.0°F | Wt 270.4 lb

## 2023-04-14 DIAGNOSIS — E118 Type 2 diabetes mellitus with unspecified complications: Secondary | ICD-10-CM | POA: Diagnosis not present

## 2023-04-14 DIAGNOSIS — E785 Hyperlipidemia, unspecified: Secondary | ICD-10-CM

## 2023-04-14 DIAGNOSIS — I10 Essential (primary) hypertension: Secondary | ICD-10-CM | POA: Diagnosis not present

## 2023-04-14 DIAGNOSIS — M6740 Ganglion, unspecified site: Secondary | ICD-10-CM | POA: Insufficient documentation

## 2023-04-14 DIAGNOSIS — E66813 Obesity, class 3: Secondary | ICD-10-CM | POA: Diagnosis not present

## 2023-04-14 DIAGNOSIS — Z6841 Body Mass Index (BMI) 40.0 and over, adult: Secondary | ICD-10-CM

## 2023-04-14 MED ORDER — SEMAGLUTIDE(0.25 OR 0.5MG/DOS) 2 MG/3ML ~~LOC~~ SOPN: 0.5000 mg | PEN_INJECTOR | SUBCUTANEOUS | 2 refills | Status: DC

## 2023-04-14 NOTE — Progress Notes (Unsigned)
04/15/2023 Name: Travis Hatfield. MRN: [id number] DOB: [birth year]  Chief Complaint  Patient presents with   Hyperlipidemia   Hypertension   Diabetes    Travis Hatfield. is a 42 y.o. year old male who presented for a telephone visit.   They were referred to the pharmacist for assistance in managing hypertension and complex medication management.    Subjective:  Care Team: Primary Care Provider: Donell Beers, FNP ; Next Scheduled Visit: 07/15/2203   Medication Access/Adherence  Current Pharmacy:  [facility] - [place], [place] - [address] [place] [address] Phone: [phone] Fax: [phone]  [id number] - [place], [place] - [address] AT [facility] [address] [place] [address] Phone: [phone] Fax: [phone]  [id number] [place],  - [address] AT [facility] OF [facility] [address] [place] [address] Phone: [phone] Fax: [phone]  [facility] [address] [place] - [address] [place] [address] Phone: [phone] Fax: [phone]    Diabetes:  Current medications: Ozempic 0.5 mg weekly - just increased   Reports tolerating Ozempic well with no adverse effects.   Current glucose readings: 99 (fasting), 119 (2-hours after a meal) Using glucose meter; testing 1-2 times/week  Patient denies hypoglycemic s/sx including dizziness, shakiness, sweating. Patient denies hyperglycemic symptoms including polyuria, polydipsia, polyphagia, nocturia, neuropathy, blurred vision.  Current meal patterns: 1-2 meals/day - Supper: Spaghetti with salad - Snacks: doesn't snack - Drinks: water only  - Cut portion sizes in half, no fast food  - Reports losing 4 lbs recently   Current physical activity: 20 minutes a day of  walking for last two weeks. Trying but try to inctrease to 30 minutes/day   Hypertension:   Current medications: lisinopril 10 mg  Patient has a validated, automated, upper arm home BP cuff Current blood pressure readings readings: not checking currently   Patient denies hypotensive s/sx including dizziness, lightheadedness.  Patient denies hypertensive symptoms including headache, chest pain, shortness of breath   Hyperlipidemia/ASCVD Risk Reduction  Current lipid lowering medications: atorvastatin 10 mg daily   ASCVD History: none Family History: diabetes (mother) Risk Factors: HTN, DM, HLD  The 10-year ASCVD risk score (Arnett DK, et al., 2019) is: 9.8%   Values used to calculate the score:     Age: 9 years     Sex: Male     Is Non-Hispanic African American: Yes     Diabetic: Yes     Tobacco smoker: No     Systolic Blood Pressure: 121 mmHg     Is BP treated: Yes     HDL Cholesterol: 47 mg/dL     Total Cholesterol: 191 mg/dL    Objective:  Lab Results  Component Value Date   HGBA1C 6.4 (A) 03/17/2023    Lab Results  Component Value Date   CREATININE 1.01 03/08/2023   BUN 12 03/08/2023   NA 136 03/08/2023   K 4.0 03/08/2023   CL 105 03/08/2023   CO2 24 03/08/2023    Lab Results  Component Value Date   CHOL 191 03/17/2023   HDL 47 03/17/2023   LDLCALC 128 (H) 03/17/2023   TRIG 88 03/17/2023   CHOLHDL 4.1 03/17/2023    Medications Reviewed Today     Reviewed by Roslyn Smiling, [facility] (Pharmacist) on 04/15/23  at 1155  Med List Status: <None>   Medication Order Taking? Sig Documenting Provider Last Dose Status Informant  atorvastatin (LIPITOR) 10 MG tablet [id number] Yes Take 1 tablet (10 mg total) by mouth daily. Donell Beers, FNP Taking Active   Blood Glucose Monitoring Suppl DEVI [id number] Yes 1 each by Does not apply route in the morning and at bedtime. May substitute to any manufacturer covered by patient's insurance. Donell Beers, FNP  Taking Active   Glucose Blood (BLOOD GLUCOSE TEST STRIPS) STRP [id number] Yes 1 each by In Vitro route in the morning and at bedtime. May substitute to any manufacturer covered by patient's insurance. Donell Beers, FNP Taking Active   Lancet Device MISC [id number] Yes 1 each by Does not apply route in the morning and at bedtime. May substitute to any manufacturer covered by patient's insurance. Donell Beers, FNP Taking Active   Lancets Misc. MISC [id number] Yes 1 each by Does not apply route 2 (two) times daily. May substitute to any manufacturer covered by patient's insurance. Donell Beers, FNP Taking Active   lisinopril (ZESTRIL) 10 MG tablet [id number] Yes Take 1 tablet (10 mg total) by mouth daily. Donell Beers, FNP Taking Active   meloxicam (MOBIC) 15 MG tablet [id number] Yes Take 15 mg by mouth daily. [provider] Taking Active   Semaglutide,0.25 or 0.5MG /DOS, 2 MG/3ML SOPN [id number] Yes Inject 0.5 mg into the skin once a week. Donell Beers, FNP Taking Active            Med Note Cornelius Moras, Tatianna Ibbotson   Tue Apr 15, 2023 11:54 AM) Will start first 0.5 mg dose on Thurs            Assessment/Plan:   Diabetes: - Currently controlled per last A1c of 6.4% - Reviewed long term cardiovascular and renal outcomes of uncontrolled blood sugar - Reviewed goal A1c, goal fasting, and goal 2 hour post prandial glucose - Reviewed dietary modifications including eating meals with mostly vegetables and protein with carbohydrates in moderation  - Recommend to continue Ozempic. First dose of Ozempic 0.5 mg will be on Thursday (11/14) - Recommend to check glucose periodically   Hypertension: - Currently controlled - Reviewed long term cardiovascular and renal outcomes of uncontrolled blood pressure - Reviewed appropriate blood pressure monitoring technique and reviewed goal blood pressure. Recommended to check home blood pressure and heart rate periodically  -  Recommend to continue lisinopril 10 mg daily   Hyperlipidemia/ASCVD Risk Reduction: - Currently uncontrolled. Most recent LDL 128 from one month ago (goal LDL <70)  - Reviewed long term complications of uncontrolled cholesterol - Recommend to continue atorvastatin 10 mg daily. Could increase to a moderate to high intensity statin if repeat lipid panel scheduled for 11/22 comes back elevated.   Follow Up Plan: PCP in February 2025   Roslyn Smiling, PharmD PGY1 Pharmacy Resident 04/15/2023 12:19 PM

## 2023-04-14 NOTE — Assessment & Plan Note (Signed)
Lab Results  Component Value Date   CHOL 191 03/17/2023   HDL 47 03/17/2023   LDLCALC 128 (H) 03/17/2023   TRIG 88 03/17/2023   CHOLHDL 4.1 03/17/2023  He has been on atorvastatin 10 mg daily for about 3 weeks We will check lipid panel next week Avoid fatty fried foods

## 2023-04-14 NOTE — Progress Notes (Signed)
Established Patient Office Visit  Subjective:  Patient ID: Travis Hatfield., male    DOB: 23-Nov-1980  Age: 42 y.o. MRN: [id number]  CC:  Chief Complaint  Patient presents with   Hypertension   Diabetes    HPI Travis Hatfield. is a 42 y.o. male  has a past medical history of Asthma, Dyslipidemia, goal LDL below 70 (09/29/2020), Ganglion cyst (04/14/2023), Hyperlipidemia, OSA (obstructive sleep apnea) (05/18/2021), Primary hypertension (05/18/2021), Sleep apnea (09/2020), and Type 2 diabetes mellitus without complication, without long-term current use of insulin (HCC) (09/29/2020).  Patient present for follow-up for his chronic medical conditions He denies any adverse reactions to current medications  Type 2 diabetes.  Doing well on Ozempic 0.25 mg once weekly injection, stated that he has been doing portion control and walking more.  He denies hypoglycemia, no complaints of polyphagia polyuria polydipsia.  Takes atorvastatin 10 mg daily for hyperlipidemia.  Has upcoming diabetic eye exam  Hypertension.  Currently on lisinopril 10 mg daily.Stated that his blood pressure has been well-controlled at home  No complaints of chest pain, shortness of breath, edema.        Past Medical History:  Diagnosis Date   Asthma    Dyslipidemia, goal LDL below 70 09/29/2020   Ganglion cyst 04/14/2023   Hyperlipidemia    OSA (obstructive sleep apnea) 05/18/2021   Primary hypertension 05/18/2021   Sleep apnea 09/2020   Type 2 diabetes mellitus without complication, without long-term current use of insulin (HCC) 09/29/2020    Past Surgical History:  Procedure Laterality Date   HERNIA REPAIR     at 11 years   TONSILLECTOMY      Family History  Problem Relation Age of Onset   Diabetes Mother    Breast cancer Mother    Stroke Sister    Lupus Sister    Kidney disease Sister    Prostate cancer Maternal Grandfather    Diabetes Maternal Grandfather     Social History    Socioeconomic History   Marital status: Married    Spouse name: Not on file   Number of children: 3   Years of education: Not on file   Highest education level: GED or equivalent  Occupational History   Not on file  Tobacco Use   Smoking status: Former    Current packs/day: 0.00    Average packs/day: 0.3 packs/day for 4.0 years (1.0 ttl pk-yrs)    Types: Cigarettes    Quit date: 11/03/2013    Years since quitting: 9.4   Smokeless tobacco: Never  Substance and Sexual Activity   Alcohol use: No   Drug use: No   Sexual activity: Yes  Other Topics Concern   Not on file  Social History Narrative   Lives with his wife   Social Determinants of Health   Financial Resource Strain: Low Risk  (04/13/2023)   Overall Financial Resource Strain (CARDIA)    Difficulty of Paying Living Expenses: Not very hard  Food Insecurity: No Food Insecurity (04/13/2023)   Hunger Vital Sign    Worried About Running Out of Food in the Last Year: Never true    Ran Out of Food in the Last Year: Never true  Transportation Needs: No Transportation Needs (04/13/2023)   PRAPARE - Administrator, Civil Service (Medical): No    Lack of Transportation (Non-Medical): No  Physical Activity: Insufficiently Active (04/13/2023)   Exercise Vital Sign    Days of Exercise per Week: 4  days    Minutes of Exercise per Session: 20 min  Stress: Stress Concern Present (04/13/2023)   [employer] of Occupational Health - Occupational Stress Questionnaire    Feeling of Stress : To some extent  Social Connections: Moderately Integrated (04/13/2023)   Social Connection and Isolation Panel [NHANES]    Frequency of Communication with Friends and Family: Three times a week    Frequency of Social Gatherings with Friends and Family: More than three times a week    Attends Religious Services: 1 to 4 times per year    Active Member of [employer] or Organizations: No    Attends Engineer, structural: Not on  file    Marital Status: Married  Intimate Partner Violence: Unknown (09/07/2021)   Received from [employer], [facility]   HITS    Physically Hurt: Not on file    Insult or Talk Down To: Not on file    Threaten Physical Harm: Not on file    Scream or Curse: Not on file    Outpatient Medications Prior to Visit  Medication Sig Dispense Refill   atorvastatin (LIPITOR) 10 MG tablet Take 1 tablet (10 mg total) by mouth daily. 90 tablet 0   Blood Glucose Monitoring Suppl DEVI 1 each by Does not apply route in the morning and at bedtime. May substitute to any manufacturer covered by patient's insurance. 1 each 0   Glucose Blood (BLOOD GLUCOSE TEST STRIPS) STRP 1 each by In Vitro route in the morning and at bedtime. May substitute to any manufacturer covered by patient's insurance. 200 strip 4   Lancet Device MISC 1 each by Does not apply route in the morning and at bedtime. May substitute to any manufacturer covered by patient's insurance. 1 each 0   Lancets Misc. MISC 1 each by Does not apply route 2 (two) times daily. May substitute to any manufacturer covered by patient's insurance. 200 each 0   lisinopril (ZESTRIL) 10 MG tablet Take 1 tablet (10 mg total) by mouth daily. 90 tablet 1   Semaglutide,0.25 or 0.5MG /DOS, 2 MG/3ML SOPN Inject 0.25 mg into the skin once a week. 3 mL 0   metFORMIN (GLUCOPHAGE) 500 MG tablet Take by mouth. (Patient not taking: Reported on 03/10/2023)     No facility-administered medications prior to visit.    Allergies  Allergen Reactions   Metformin And Related     diarhea    ROS Review of Systems  Constitutional:  Negative for activity change, appetite change, chills, diaphoresis, fatigue, fever and unexpected weight change.  HENT:  Negative for congestion, dental problem, drooling and ear discharge.   Eyes:  Negative for pain, discharge, redness and itching.  Respiratory:  Negative for apnea, cough, choking, chest tightness, shortness of breath and  wheezing.   Cardiovascular: Negative.  Negative for chest pain, palpitations and leg swelling.  Gastrointestinal:  Negative for abdominal distention, abdominal pain, anal bleeding, blood in stool, constipation, diarrhea and vomiting.  Endocrine: Negative for polydipsia, polyphagia and polyuria.  Genitourinary:  Negative for difficulty urinating, flank pain, frequency and genital sores.  Musculoskeletal: Negative.  Negative for arthralgias, back pain, gait problem and joint swelling.  Skin:  Negative for color change, pallor and rash.  Neurological:  Negative for dizziness, facial asymmetry, light-headedness, numbness and headaches.  Psychiatric/Behavioral:  Negative for agitation, behavioral problems, confusion, hallucinations, self-injury, sleep disturbance and suicidal ideas.       Objective:    Physical Exam Vitals and nursing note reviewed.  Constitutional:  General: He is not in acute distress.    Appearance: Normal appearance. He is obese. He is not ill-appearing, toxic-appearing or diaphoretic.  HENT:     Mouth/Throat:     Mouth: Mucous membranes are moist.     Pharynx: Oropharynx is clear. No oropharyngeal exudate or posterior oropharyngeal erythema.  Eyes:     General: No scleral icterus.       Right eye: No discharge.        Left eye: No discharge.     Extraocular Movements: Extraocular movements intact.     Conjunctiva/sclera: Conjunctivae normal.  Cardiovascular:     Rate and Rhythm: Normal rate and regular rhythm.     Pulses: Normal pulses.     Heart sounds: Normal heart sounds. No murmur heard.    No friction rub. No gallop.  Pulmonary:     Effort: Pulmonary effort is normal. No respiratory distress.     Breath sounds: Normal breath sounds. No stridor. No wheezing, rhonchi or rales.  Chest:     Chest wall: No tenderness.  Abdominal:     General: There is no distension.     Palpations: Abdomen is soft.     Tenderness: There is no abdominal tenderness. There  is no right CVA tenderness, left CVA tenderness or guarding.  Musculoskeletal:        General: No swelling, tenderness, deformity or signs of injury.     Right lower leg: No edema.     Left lower leg: No edema.  Skin:    General: Skin is warm and dry.     Capillary Refill: Capillary refill takes less than 2 seconds.     Coloration: Skin is not jaundiced or pale.     Findings: No bruising, erythema or lesion.     Comments: Small ganglion cyst noted on the medial aspect of left wrist.  No tenderness or redness noted      Neurological:     Mental Status: He is alert and oriented to person, place, and time.     Motor: No weakness.     Coordination: Coordination normal.     Gait: Gait normal.  Psychiatric:        Mood and Affect: Mood normal.        Behavior: Behavior normal.        Thought Content: Thought content normal.        Judgment: Judgment normal.     BP 121/65   Pulse 72   Temp (!) 97 F (36.1 C)   Wt 270 lb 6.4 oz (122.7 kg)   SpO2 100%   BMI 43.64 kg/m  Wt Readings from Last 3 Encounters:  04/14/23 270 lb 6.4 oz (122.7 kg)  03/17/23 274 lb (124.3 kg)  03/08/23 263 lb (119.3 kg)    No results found for: "TSH" Lab Results  Component Value Date   WBC 4.1 03/08/2023   HGB 15.4 03/08/2023   HCT 46.9 03/08/2023   MCV 82.6 03/08/2023   PLT 216 03/08/2023   Lab Results  Component Value Date   NA 136 03/08/2023   K 4.0 03/08/2023   CO2 24 03/08/2023   GLUCOSE 133 (H) 03/08/2023   BUN 12 03/08/2023   CREATININE 1.01 03/08/2023   BILITOT 0.5 10/18/2018   ALKPHOS 61 10/18/2018   AST 19 10/18/2018   ALT 21 10/18/2018   PROT 6.7 10/18/2018   ALBUMIN 4.1 10/18/2018   CALCIUM 8.7 (L) 03/08/2023   ANIONGAP 7 03/08/2023   Lab  Results  Component Value Date   CHOL 191 03/17/2023   Lab Results  Component Value Date   HDL 47 03/17/2023   Lab Results  Component Value Date   LDLCALC 128 (H) 03/17/2023   Lab Results  Component Value Date   TRIG 88  03/17/2023   Lab Results  Component Value Date   CHOLHDL 4.1 03/17/2023   Lab Results  Component Value Date   HGBA1C 6.4 (A) 03/17/2023      Assessment & Plan:   Problem List Items Addressed This Visit       Cardiovascular and Mediastinum   Primary hypertension    BP Readings from Last 3 Encounters:  04/14/23 121/65  03/30/23 (!) 141/88  03/17/23 134/63   HTN Controlled .  Lisinopril 10 mg daily Continue current medications. No changes in management. Discussed DASH diet and dietary sodium restrictions Continue to increase dietary efforts and exercise.           Endocrine   Type 2 diabetes mellitus without complication, without long-term current use of insulin (HCC)    Doing well on Ozempic 0.25 mg once weekly injection Start Ozempic 0.5 mg once weekly after completion of Ozempic 0.25 mg once weekly injection Patient counseled on low-carb modified diets Encouraged to get diabetic eye exam done as planned Follow-up in 3 months      Relevant Medications   Semaglutide,0.25 or 0.5MG /DOS, 2 MG/3ML SOPN     Other   Dyslipidemia, goal LDL below 70 - Primary    Lab Results  Component Value Date   CHOL 191 03/17/2023   HDL 47 03/17/2023   LDLCALC 128 (H) 03/17/2023   TRIG 88 03/17/2023   CHOLHDL 4.1 03/17/2023  He has been on atorvastatin 10 mg daily for about 3 weeks We will check lipid panel next week Avoid fatty fried foods      Relevant Orders   Lipid panel   Class 3 severe obesity due to excess calories with serious comorbidity and body mass index (BMI) of 40.0 to 44.9 in adult (HCC)    Wt Readings from Last 3 Encounters:  04/14/23 270 lb 6.4 oz (122.7 kg)  03/17/23 274 lb (124.3 kg)  03/08/23 263 lb (119.3 kg)   Body mass index is 43.64 kg/m.  Patient has lost about 4 pounds since last visit Patient counseled on low-carb modified diet Encouraged engage in regular to moderate to vigorous exercises at least 150 minutes weekly Starting Ozempic 0.5 mg  once weekly after completion of Ozempic 0.25 mg once weekly injection      Relevant Medications   Semaglutide,0.25 or 0.5MG /DOS, 2 MG/3ML SOPN   Ganglion cyst    Site on left wrist is not tender Encouraged to report tenderness       Meds ordered this encounter  Medications   Semaglutide,0.25 or 0.5MG /DOS, 2 MG/3ML SOPN    Sig: Inject 0.5 mg into the skin once a week.    Dispense:  3 mL    Refill:  2    Follow-up: Return in about 3 months (around 07/15/2023) for FASTING LABS next WEEK around thursday or friday , CPE.    Donell Beers, FNP

## 2023-04-14 NOTE — Assessment & Plan Note (Signed)
Site on left wrist is not tender Encouraged to report tenderness

## 2023-04-14 NOTE — Assessment & Plan Note (Signed)
Doing well on Ozempic 0.25 mg once weekly injection Start Ozempic 0.5 mg once weekly after completion of Ozempic 0.25 mg once weekly injection Patient counseled on low-carb modified diets Encouraged to get diabetic eye exam done as planned Follow-up in 3 months

## 2023-04-14 NOTE — Patient Instructions (Signed)
1. Dyslipidemia, goal LDL below 70  - Lipid panel; Future  2. Primary hypertension   3. Type 2 diabetes mellitus without complication, without long-term current use of insulin (HCC)  - Semaglutide,0.25 or 0.5MG /DOS, 2 MG/3ML SOPN; Inject 0.5 mg into the skin once a week.  Dispense: 3 mL; Refill: 2  4. Class 3 severe obesity due to excess calories with serious comorbidity and body mass index (BMI) of 40.0 to 44.9 in adult (HCC)   5. Ganglion cyst    It is important that you exercise regularly at least 30 minutes 5 times a week as tolerated  Think about what you will eat, plan ahead. Choose " clean, green, fresh or frozen" over canned, processed or packaged foods which are more sugary, salty and fatty. 70 to 75% of food eaten should be vegetables and fruit. Three meals at set times with snacks allowed between meals, but they must be fruit or vegetables. Aim to eat over a 12 hour period , example 7 am to 7 pm, and STOP after  your last meal of the day. Drink water,generally about 64 ounces per day, no other drink is as healthy. Fruit juice is best enjoyed in a healthy way, by EATING the fruit.  Thanks for choosing [facility] we consider it a privelige to serve you.

## 2023-04-14 NOTE — Assessment & Plan Note (Addendum)
Wt Readings from Last 3 Encounters:  04/14/23 270 lb 6.4 oz (122.7 kg)  03/17/23 274 lb (124.3 kg)  03/08/23 263 lb (119.3 kg)   Body mass index is 43.64 kg/m.  Patient has lost about 4 pounds since last visit Patient counseled on low-carb modified diet Encouraged engage in regular to moderate to vigorous exercises at least 150 minutes weekly Starting Ozempic 0.5 mg once weekly after completion of Ozempic 0.25 mg once weekly injection

## 2023-04-14 NOTE — Assessment & Plan Note (Signed)
BP Readings from Last 3 Encounters:  04/14/23 121/65  03/30/23 (!) 141/88  03/17/23 134/63   HTN Controlled .  Lisinopril 10 mg daily Continue current medications. No changes in management. Discussed DASH diet and dietary sodium restrictions Continue to increase dietary efforts and exercise.

## 2023-04-15 ENCOUNTER — Other Ambulatory Visit: Payer: BLUE CROSS/BLUE SHIELD

## 2023-04-15 ENCOUNTER — Telehealth: Payer: Self-pay | Admitting: Pharmacist

## 2023-04-15 NOTE — Progress Notes (Signed)
Attempted to contact patient for scheduled appointment for medication management. Left HIPAA compliant message for patient to return my call at their convenience.   Catie T. Harper, PharmD, BCACP, CPP Clinical Pharmacist [facility] [phone]  

## 2023-04-15 NOTE — Patient Instructions (Signed)
Mr. Joanis,   It was great talking to you!  We will be in touch after your upcoming labs if we need to schedule follow up.   Thanks!  Catie Eppie Gibson, PharmD, BCACP, CPP Clinical Pharmacist [facility] [phone]

## 2023-04-25 ENCOUNTER — Other Ambulatory Visit: Payer: BLUE CROSS/BLUE SHIELD

## 2023-04-25 DIAGNOSIS — E785 Hyperlipidemia, unspecified: Secondary | ICD-10-CM

## 2023-04-26 LAB — LIPID PANEL
Chol/HDL Ratio: 2.8 ratio (ref 0.0–5.0)
Cholesterol, Total: 113 mg/dL (ref 100–199)
HDL: 41 mg/dL (ref >39)
LDL Chol Calc (NIH): 58 mg/dL (ref 0–99)
Triglycerides: 64 mg/dL (ref 0–149)
VLDL Cholesterol Cal: 14 mg/dL (ref 5–40)

## 2023-05-08 LAB — HM DIABETES EYE EXAM

## 2023-05-18 ENCOUNTER — Other Ambulatory Visit: Payer: Self-pay | Admitting: Nurse Practitioner

## 2023-05-18 DIAGNOSIS — E118 Type 2 diabetes mellitus with unspecified complications: Secondary | ICD-10-CM

## 2023-05-19 ENCOUNTER — Other Ambulatory Visit (HOSPITAL_COMMUNITY): Payer: Self-pay | Admitting: Nurse Practitioner

## 2023-05-19 DIAGNOSIS — E118 Type 2 diabetes mellitus with unspecified complications: Secondary | ICD-10-CM

## 2023-05-19 MED ORDER — SEMAGLUTIDE(0.25 OR 0.5MG/DOS) 2 MG/3ML ~~LOC~~ SOPN: 0.5000 mg | PEN_INJECTOR | SUBCUTANEOUS | 2 refills | Status: DC

## 2023-06-16 ENCOUNTER — Other Ambulatory Visit: Payer: Self-pay | Admitting: Nurse Practitioner

## 2023-06-16 DIAGNOSIS — E785 Hyperlipidemia, unspecified: Secondary | ICD-10-CM

## 2023-07-15 ENCOUNTER — Ambulatory Visit: Payer: Self-pay | Admitting: Nurse Practitioner

## 2023-09-11 ENCOUNTER — Other Ambulatory Visit: Payer: Self-pay | Admitting: Nurse Practitioner

## 2023-09-11 DIAGNOSIS — I10 Essential (primary) hypertension: Secondary | ICD-10-CM

## 2023-10-09 IMAGING — CR DG CHEST 2V
2 series · 2 of 2 positions shown · non-contrast
Comparison: 04/29/2018

CLINICAL DATA: Chest pain

EXAM:
CHEST - 2 VIEW

[chest pa]
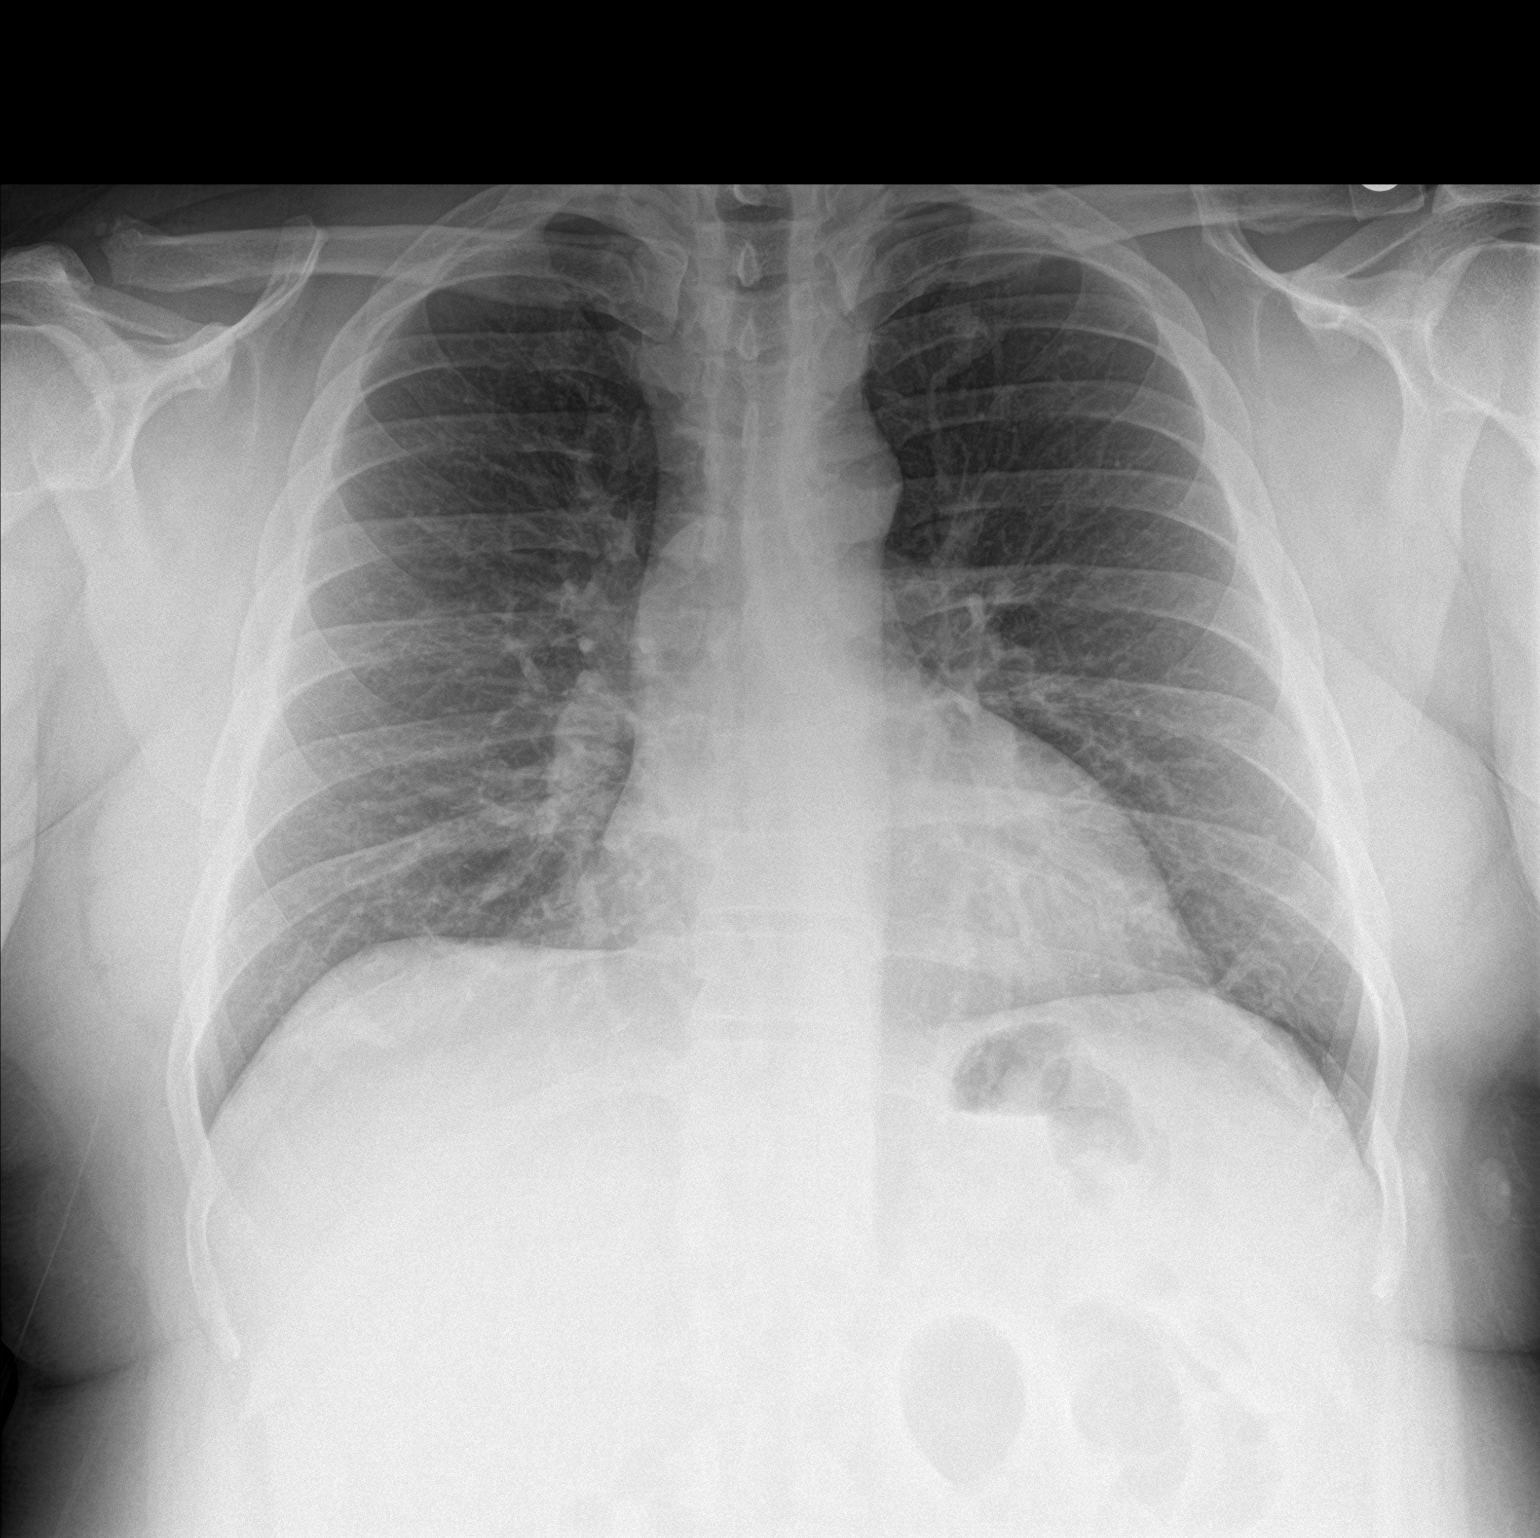

[chest lat]
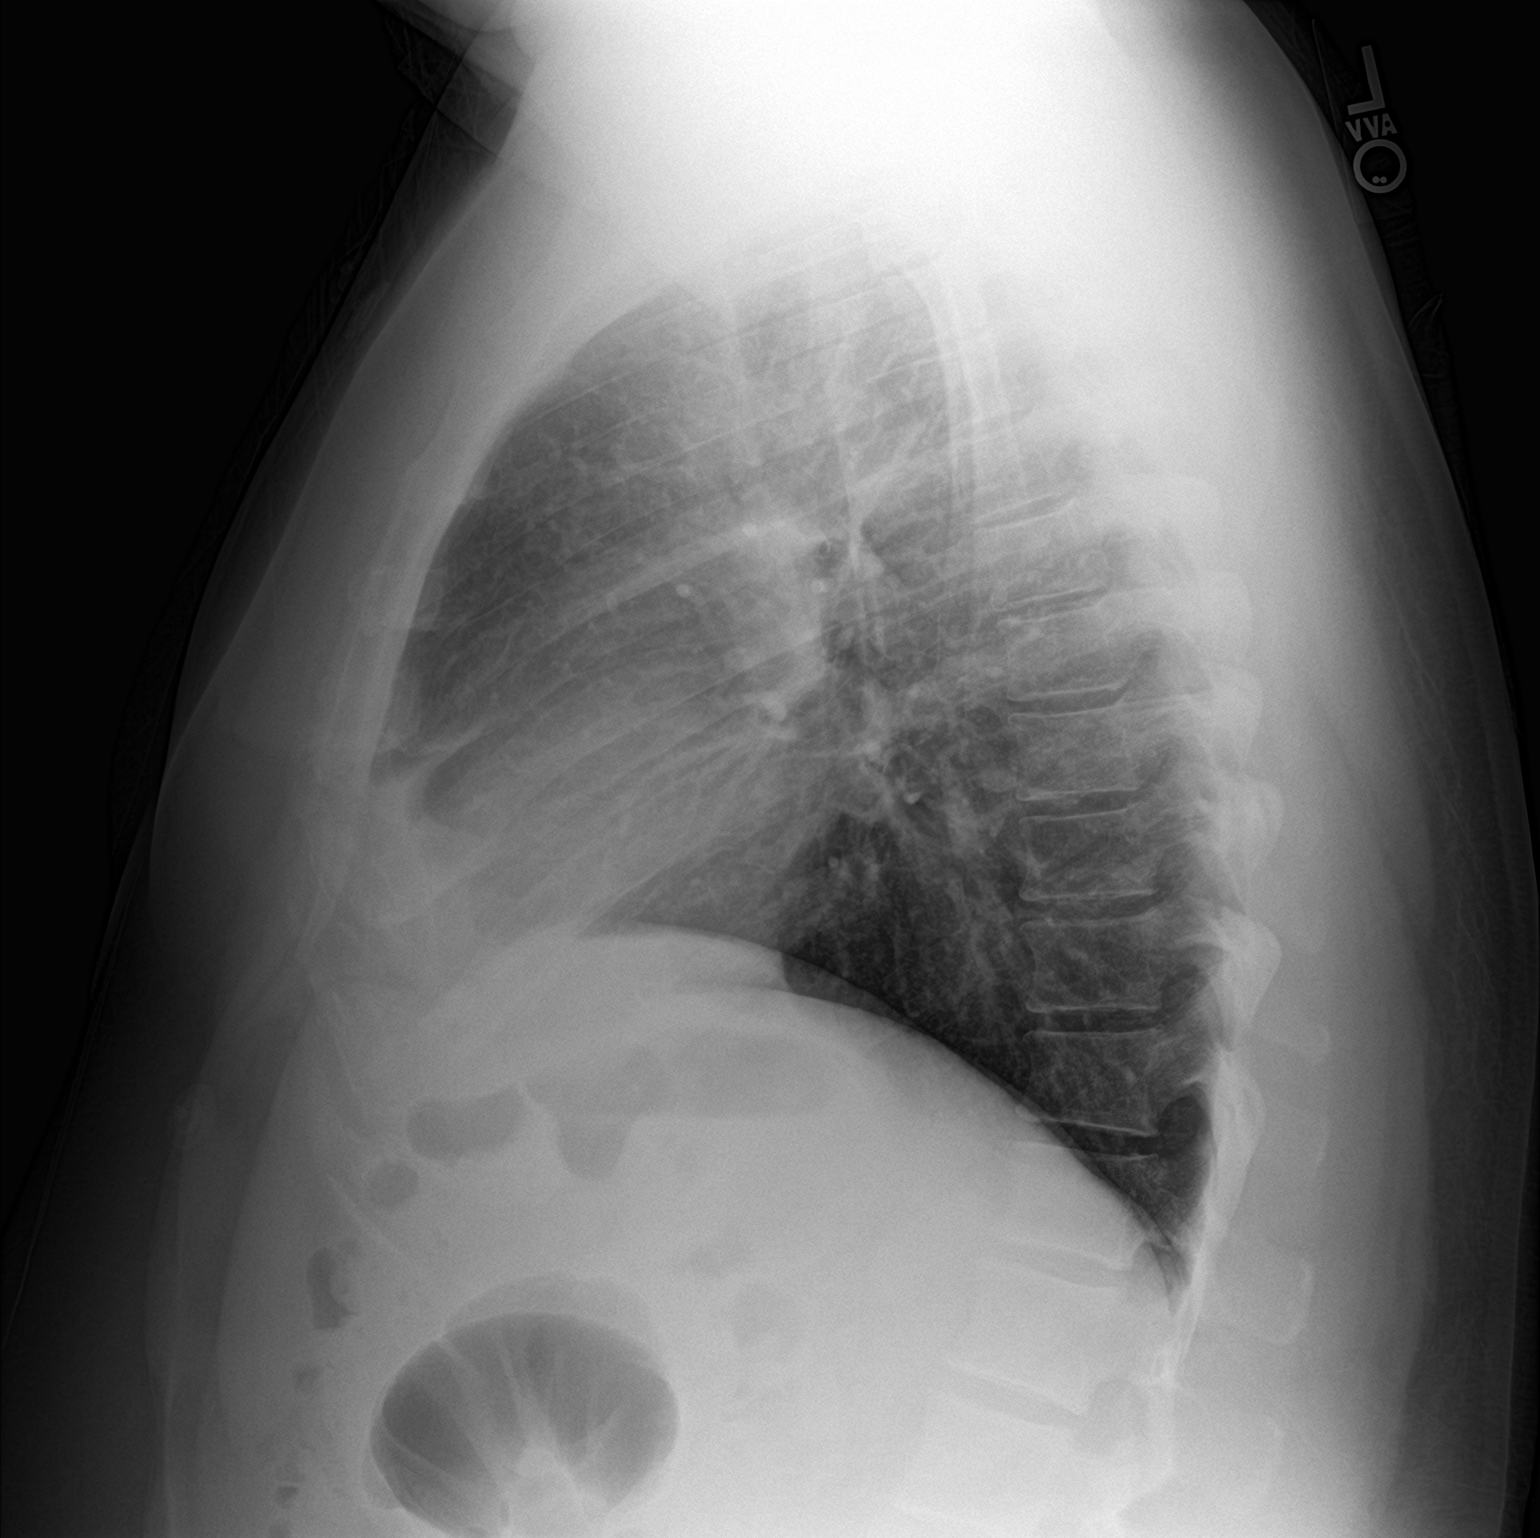

[2 of 2 positions shown; findings below may reference images not displayed]

FINDINGS: The heart size and mediastinal contours are within normal limits.
Both lungs are clear. The visualized skeletal structures are
unremarkable.
IMPRESSION: No active cardiopulmonary disease.

## 2023-12-18 ENCOUNTER — Other Ambulatory Visit: Payer: Self-pay | Admitting: Nurse Practitioner

## 2023-12-18 DIAGNOSIS — E785 Hyperlipidemia, unspecified: Secondary | ICD-10-CM

## 2024-03-25 ENCOUNTER — Other Ambulatory Visit: Payer: Self-pay | Admitting: Nurse Practitioner

## 2024-03-25 DIAGNOSIS — E785 Hyperlipidemia, unspecified: Secondary | ICD-10-CM

## 2024-06-22 ENCOUNTER — Telehealth: Payer: Self-pay

## 2024-06-22 NOTE — Telephone Encounter (Signed)
 Copied from CRM [id number]. Topic: Appointments - Transfer of Care >> Jun 22, 2024 10:51 AM Antony RAMAN wrote: Pt is requesting to transfer FROM: [facility] Pt is requesting to transfer TO: duncan vincent Reason for requested transfer: pt request It is the responsibility of the team the patient would like to transfer to (Dr. Jerrell) to reach out to the patient if for any reason this transfer is not acceptable.

## 2024-06-25 ENCOUNTER — Ambulatory Visit: Admitting: Student in an Organized Health Care Education/Training Program

## 2024-06-25 ENCOUNTER — Encounter: Payer: Self-pay | Admitting: Student in an Organized Health Care Education/Training Program

## 2024-06-25 VITALS — BP 141/89 | HR 87 | Temp 97.7°F | Ht 66.0 in | Wt 262.0 lb

## 2024-06-25 DIAGNOSIS — G4733 Obstructive sleep apnea (adult) (pediatric): Secondary | ICD-10-CM

## 2024-06-25 DIAGNOSIS — E119 Type 2 diabetes mellitus without complications: Secondary | ICD-10-CM

## 2024-06-25 DIAGNOSIS — E66813 Obesity, class 3: Secondary | ICD-10-CM

## 2024-06-25 DIAGNOSIS — R7303 Prediabetes: Secondary | ICD-10-CM

## 2024-06-25 DIAGNOSIS — M6740 Ganglion, unspecified site: Secondary | ICD-10-CM

## 2024-06-25 DIAGNOSIS — Z23 Encounter for immunization: Secondary | ICD-10-CM

## 2024-06-25 DIAGNOSIS — E7800 Pure hypercholesterolemia, unspecified: Secondary | ICD-10-CM

## 2024-06-25 DIAGNOSIS — I10 Essential (primary) hypertension: Secondary | ICD-10-CM

## 2024-06-25 DIAGNOSIS — E118 Type 2 diabetes mellitus with unspecified complications: Secondary | ICD-10-CM

## 2024-06-25 LAB — LIPID PANEL
Cholesterol: 157 mg/dL (ref 28–200)
HDL: 52.8 mg/dL
LDL Cholesterol: 88 mg/dL (ref 10–99)
NonHDL: 103.83
Total CHOL/HDL Ratio: 3
Triglycerides: 79 mg/dL (ref 10.0–149.0)
VLDL: 15.8 mg/dL (ref 0.0–40.0)

## 2024-06-25 LAB — COMPREHENSIVE METABOLIC PANEL WITH GFR
ALT: 15 U/L (ref 3–53)
AST: 12 U/L (ref 5–37)
Albumin: 4.5 g/dL (ref 3.5–5.2)
Alkaline Phosphatase: 58 U/L (ref 39–117)
BUN: 14 mg/dL (ref 6–23)
CO2: 30 meq/L (ref 19–32)
Calcium: 9.4 mg/dL (ref 8.4–10.5)
Chloride: 105 meq/L (ref 96–112)
Creatinine, Ser: 0.94 mg/dL (ref 0.40–1.50)
GFR: 98.83 mL/min
Glucose, Bld: 122 mg/dL — ABNORMAL HIGH (ref 70–99)
Potassium: 4.2 meq/L (ref 3.5–5.1)
Sodium: 142 meq/L (ref 135–145)
Total Bilirubin: 0.7 mg/dL (ref 0.2–1.2)
Total Protein: 7 g/dL (ref 6.0–8.3)

## 2024-06-25 LAB — HEMOGLOBIN A1C: Hgb A1c MFr Bld: 7.1 % — ABNORMAL HIGH (ref 4.6–6.5)

## 2024-06-25 LAB — MICROALBUMIN / CREATININE URINE RATIO
Creatinine,U: 150.9 mg/dL
Microalb Creat Ratio: 10.3 mg/g (ref 0.0–30.0)
Microalb, Ur: 1.6 mg/dL (ref 0.7–1.9)

## 2024-06-25 NOTE — Assessment & Plan Note (Signed)
 Chronic and stable.  Blood pressure currently a little elevated above goal at 141/89.  Using lisinopril  10 mg daily.  Will monitor this, likely can increase in the future to a higher dose ARB.  Will check urine for proteinuria today.

## 2024-06-25 NOTE — Assessment & Plan Note (Signed)
 New diagnosis.  A1c today 7.1%.  He has a history of prediabetes.  Associated with metabolic syndrome.  Based on comorbidities, we will start him on semaglutide  0.25 mg weekly.  Follow-up in 3 months for A1c recheck.

## 2024-06-25 NOTE — Assessment & Plan Note (Signed)
 Exam today consistent with a ganglion cyst on his left wrist which is chronic and then a new or a ganglion cyst on the side of his right lateral foot.  Both of these are nontender, no skin changes, pretty asymptomatic.  We talked about supportive care and expectant management.  Surgery could be an option in the future if needed.

## 2024-06-25 NOTE — Assessment & Plan Note (Signed)
 Chronic issue.  Weight today 262 pounds with a BMI 42.  He has metabolic syndrome complications including hypertension, hyperlipidemia, and hyperglycemia.  Also complicated by obstructive sleep apnea on CPAP machine.  Attempts at diet and exercise have not resulted in weight loss.  He tolerated the GLP-1 agonist well for about 5 months last year, lost significant weight, lost access to that medicine over the last several months since has seen weight return.  No issues with thyroid disease or family history of thyroid cancer.  No contraindications to GLP-1 agonist.  Very good candidate for using a GLP-1 agonist.  Will recheck labs today, check A1c to evaluate for diabetes.  Based on those labs will prescribe a GLP-1 agonist, likely semaglutide , and start at the low dose.  We talked about expected side effects.  I will see him in the clinic every 2-3 months for monitored weight loss program.

## 2024-06-25 NOTE — Progress Notes (Signed)
 "  New Patient Office Visit  Patient ID: Travis Hatfield., Male   DOB: 1980/10/31 44 y.o. MRN: [id number]  Chief Complaint  Patient presents with   Transitions Of Care    Patient wants to discuss getting back on Ozempic .    Subjective:     Travis Hatfield. presents to establish care  HPI  Discussed the use of AI scribe software for clinical note transcription with the patient, who gave verbal consent to proceed.  History of Present Illness Travis Hatfield. is a 44 year old male who presents for weight management.  He is seeking to restart Ozempic  for weight management. Previously, he was on Ozempic  for four to five months, during which he lost approximately 45 pounds, reducing his weight from 267 pounds to a low of 239 pounds. However, after discontinuing the medication about four months ago due to loss of insurance, his weight increased to 262 pounds. He wants to prevent further weight gain and requires assistance in managing his weight.  He has a history of borderline diabetes, with a previous blood sugar level of 6.4% in 2024. He is currently not diagnosed with diabetes but is concerned about his weight and its potential impact on his health. He has been on lisinopril  for slight blood pressure issues and a cholesterol medication for the past six to seven months. He takes the cholesterol medication daily, although he has not refilled it since July.  He works as a energy manager and lives with his wife and three children, aged ten, nine, and seven. He describes his diet as improved but struggles with food cravings, especially with unhealthy food options at home. His exercise routine could be better, and he has not joined a gym yet. No symptoms of depression or anxiety, and no physical limitations such as chest pain or shortness of breath that would prevent him from exercising. He uses a CPAP machine for sleep apnea, which he has been on for two years, and reports it helps  with his sleep.  He reports a ganglion cyst on his foot that developed in the last six months and keloids on the back of his neck, for which a sample was taken about a year ago to check for cancer. He has had toxin removal and hernia surgery in the past.   Outpatient Encounter Medications as of 06/25/2024  Medication Sig   atorvastatin  (LIPITOR) 10 MG tablet TAKE 1 TABLET(10 MG) BY MOUTH DAILY   lisinopril  (ZESTRIL ) 10 MG tablet TAKE 1 TABLET(10 MG) BY MOUTH DAILY   [DISCONTINUED] Blood Glucose Monitoring Suppl DEVI 1 each by Does not apply route in the morning and at bedtime. May substitute to any manufacturer covered by patient's insurance.   [DISCONTINUED] meloxicam (MOBIC) 15 MG tablet Take 15 mg by mouth daily.   [DISCONTINUED] Semaglutide ,0.25 or 0.5MG /DOS, 2 MG/3ML SOPN Inject 0.5 mg into the skin once a week. (Patient not taking: Reported on 06/25/2024)   No facility-administered encounter medications on file as of 06/25/2024.    Past Medical History:  Diagnosis Date   Asthma    Dyslipidemia, goal LDL below 70 09/29/2020   Ganglion cyst 04/14/2023   Hyperlipidemia    OSA (obstructive sleep apnea) 05/18/2021   Primary hypertension 05/18/2021   Sleep apnea 09/2020   Type 2 diabetes mellitus without complication, without long-term current use of insulin (HCC) 09/29/2020    Past Surgical History:  Procedure Laterality Date   HERNIA REPAIR     at  11 years   TONSILLECTOMY      Family History  Problem Relation Age of Onset   Diabetes Mother    Breast cancer Mother    Stroke Sister    Lupus Sister    Kidney disease Sister    Prostate cancer Maternal Grandfather    Diabetes Maternal Grandfather        Objective:    BP (!) 141/89   Pulse 87   Temp 97.7 F (36.5 C) (Oral)   Ht 5' 6 (1.676 m)   Wt 262 lb (118.8 kg)   SpO2 100%   BMI 42.29 kg/m   Physical Exam  Gen: Well-appearing man Ears: Normal hearing, normal tympanic membranes bilaterally Mouth: Crowded  posterior oropharynx, no oral lesions Neck: Keloid scarring on the right side of his upper posterior neck behind the ear, normal thyroid, no nodules or adenopathy, increased circumference Heart: Regular, no murmur Lungs: Unlabored, clear throughout Ext: Warm, no edema, 1.5 cm ganglion cyst is present on his left wrist and on the side of his right foot. Neuro: Alert, conversational, full strength upper and lower extremities, normal gait and balance      Assessment & Plan:   Problem List Items Addressed This Visit       High   Primary hypertension (Chronic)   Chronic and stable.  Blood pressure currently a little elevated above goal at 141/89.  Using lisinopril  10 mg daily.  Will monitor this, likely can increase in the future to a higher dose ARB.  Will check urine for proteinuria today.      Relevant Orders   Comprehensive metabolic panel with GFR   Microalbumin / creatinine urine ratio   Obesity, Class III, BMI 40-49.9 (HCC) - Primary (Chronic)   Chronic issue.  Weight today 262 pounds with a BMI 42.  He has metabolic syndrome complications including hypertension, hyperlipidemia, and hyperglycemia.  Also complicated by obstructive sleep apnea on CPAP machine.  Attempts at diet and exercise have not resulted in weight loss.  He tolerated the GLP-1 agonist well for about 5 months last year, lost significant weight, lost access to that medicine over the last several months since has seen weight return.  No issues with thyroid disease or family history of thyroid cancer.  No contraindications to GLP-1 agonist.  Very good candidate for using a GLP-1 agonist.  Will recheck labs today, check A1c to evaluate for diabetes.  Based on those labs will prescribe a GLP-1 agonist, likely semaglutide , and start at the low dose.  We talked about expected side effects.  I will see him in the clinic every 2-3 months for monitored weight loss program.      Relevant Orders   Comprehensive metabolic panel  with GFR     Medium    Hyperlipidemia (Chronic)   Chronic and stable.  Using atorvastatin  10 mg daily.  Will check lipids today.  Last refill of that medication was in July but he reports that he has plenty of the medicine at home.      Relevant Orders   Lipid panel   OSA (obstructive sleep apnea) (Chronic)   Chronic issue, on CPAP therapy for about the last 2 years and tolerating it really well.      Prediabetes (Chronic)   Chronic issue.  He reports he has had a history of prediabetes but never diabetes.  Will check an A1c today.      Relevant Orders   Hemoglobin A1c     Low  Ganglion cyst (Chronic)   Exam today consistent with a ganglion cyst on his left wrist which is chronic and then a new or a ganglion cyst on the side of his right lateral foot.  Both of these are nontender, no skin changes, pretty asymptomatic.  We talked about supportive care and expectant management.  Surgery could be an option in the future if needed.      Other Visit Diagnoses       Immunization due       Relevant Orders   Flu vaccine trivalent PF, 6mos and older(Flulaval,Afluria,Fluarix,Fluzone)       Return in about 3 months (around 09/23/2024).   Cleatus Debby Specking, MD [facility]     "

## 2024-06-25 NOTE — Assessment & Plan Note (Signed)
 Chronic issue, on CPAP therapy for about the last 2 years and tolerating it really well.

## 2024-06-25 NOTE — Assessment & Plan Note (Signed)
 Chronic and stable.  Using atorvastatin  10 mg daily.  Will check lipids today.  Last refill of that medication was in July but he reports that he has plenty of the medicine at home.

## 2024-06-25 NOTE — Patient Instructions (Signed)
" °  VISIT SUMMARY: During your visit, we discussed your weight management goals and the challenges you have faced since stopping Ozempic . We also reviewed your history of borderline diabetes, hypertension, hyperlipidemia, and other health concerns. We have developed a plan to help you manage your weight and overall health.  YOUR PLAN: -OBESITY: Obesity is a condition characterized by excessive body fat that increases the risk of health problems. We will restart you on a GLP-1 medication at a low dose and gradually increase it every four weeks as tolerated. We discussed potential side effects, insurance issues, and new oral options. Blood work has been ordered to check your blood sugars, cholesterol, and kidney function.  -HYPERTENSION: Hypertension, or high blood pressure, is a condition where the force of the blood against your artery walls is too high. Your hypertension is currently managed with lisinopril .  -HYPERLIPIDEMIA: Hyperlipidemia is a condition where there are high levels of fats (lipids) in the blood. You are managing this with a cholesterol medication, although you have not refilled it since July. Please ensure you continue taking it daily.  -GANGLION CYST OF FOOT: A ganglion cyst is a non-cancerous lump that commonly develops along the tendons or joints of your wrists or feet. Your cyst is non-painful and non-cancerous. We will monitor it for any changes in size.  -KELOID SCAR OF NECK: A keloid scar is an overgrowth of scar tissue that develops around a wound. Your keloid scar has been previously evaluated by a dermatologist. We will continue to monitor it for any changes.  INSTRUCTIONS: Please follow up with the ordered blood work to check your blood sugars, cholesterol, and kidney function. Continue taking your medications as prescribed and monitor your ganglion cyst and keloid scar for any changes. If you experience any side effects from the GLP-1 medication or have any concerns,  please contact our office.    Contains text generated by [employer].   "

## 2024-06-28 ENCOUNTER — Other Ambulatory Visit (HOSPITAL_COMMUNITY): Payer: Self-pay

## 2024-06-28 ENCOUNTER — Telehealth: Payer: Self-pay

## 2024-06-28 ENCOUNTER — Ambulatory Visit: Payer: Self-pay | Admitting: Student in an Organized Health Care Education/Training Program

## 2024-06-28 MED ORDER — OZEMPIC (0.25 OR 0.5 MG/DOSE) 2 MG/3ML ~~LOC~~ SOPN: 0.2500 mg | PEN_INJECTOR | SUBCUTANEOUS | 2 refills | Status: AC

## 2024-06-28 NOTE — Addendum Note (Signed)
 Addended by: JERRELL SOLIAN T on: 06/28/2024 07:50 AM   Modules accepted: Orders

## 2024-06-28 NOTE — Telephone Encounter (Signed)
 Pharmacy Patient Advocate Encounter   Received notification from Physician's Office that prior authorization for Ozempic  (0.25 or 0.5mg ) 2MG /3ML PEN is required/requested.   Insurance verification completed.   The patient is insured through [employer].   Per test claim: PA required; PA submitted to above mentioned insurance via Fax Key/confirmation #/[id number] - Status is pending  Faxed to [phone]

## 2024-07-06 ENCOUNTER — Other Ambulatory Visit: Payer: Self-pay

## 2024-07-06 DIAGNOSIS — E119 Type 2 diabetes mellitus without complications: Secondary | ICD-10-CM

## 2024-07-06 NOTE — Telephone Encounter (Signed)
Do we have an update on PA?

## 2024-07-07 ENCOUNTER — Other Ambulatory Visit (HOSPITAL_COMMUNITY): Payer: Self-pay

## 2024-07-07 ENCOUNTER — Other Ambulatory Visit: Payer: Self-pay | Admitting: Nurse Practitioner

## 2024-07-07 DIAGNOSIS — I10 Essential (primary) hypertension: Secondary | ICD-10-CM

## 2024-07-07 NOTE — Telephone Encounter (Signed)
 Called patient and [facility]. If patient calls back, please let him know that we are still waiting on insurance to approve medication.

## 2024-07-08 ENCOUNTER — Other Ambulatory Visit: Payer: Self-pay

## 2024-07-08 DIAGNOSIS — I10 Essential (primary) hypertension: Secondary | ICD-10-CM

## 2024-07-08 MED ORDER — LISINOPRIL 10 MG PO TABS: 10.0000 mg | ORAL_TABLET | Freq: Every day | ORAL | 1 refills | Status: AC

## 2024-07-08 NOTE — Telephone Encounter (Signed)
 Patient has been informed this is awaiting determination

## 2024-07-08 NOTE — Telephone Encounter (Signed)
 lisinopril  (ZESTRIL ) 10 MG tablet [Pharmacy Med Name: LISINOPRIL  10MG  TABLETS]
